# Patient Record
Sex: Male | Born: 1970 | Hispanic: Yes | Marital: Married | Smoking: Never smoker
Health system: Southern US, Community
[De-identification: ages and names within clinical notes are randomized; demographics above are authoritative.]

## PROBLEM LIST (undated history)

## (undated) DIAGNOSIS — R7303 Prediabetes: Secondary | ICD-10-CM

## (undated) DIAGNOSIS — N189 Chronic kidney disease, unspecified: Secondary | ICD-10-CM

## (undated) DIAGNOSIS — Z87442 Personal history of urinary calculi: Secondary | ICD-10-CM

## (undated) DIAGNOSIS — G709 Myoneural disorder, unspecified: Secondary | ICD-10-CM

## (undated) DIAGNOSIS — T7840XA Allergy, unspecified, initial encounter: Secondary | ICD-10-CM

## (undated) DIAGNOSIS — G4733 Obstructive sleep apnea (adult) (pediatric): Secondary | ICD-10-CM

## (undated) DIAGNOSIS — D759 Disease of blood and blood-forming organs, unspecified: Secondary | ICD-10-CM

## (undated) DIAGNOSIS — J45909 Unspecified asthma, uncomplicated: Secondary | ICD-10-CM

## (undated) DIAGNOSIS — J189 Pneumonia, unspecified organism: Secondary | ICD-10-CM

## (undated) HISTORY — PX: VASECTOMY: SHX75

## (undated) HISTORY — DX: Unspecified asthma, uncomplicated: J45.909

## (undated) HISTORY — DX: Allergy, unspecified, initial encounter: T78.40XA

## (undated) HISTORY — PX: MOUTH SURGERY: SHX715

---

## 2012-07-18 ENCOUNTER — Ambulatory Visit (INDEPENDENT_AMBULATORY_CARE_PROVIDER_SITE_OTHER): Payer: BC Managed Care – PPO | Admitting: Internal Medicine

## 2012-07-18 VITALS — BP 126/71 | HR 63 | Temp 97.8°F | Resp 16 | Ht 66.5 in | Wt 190.0 lb

## 2012-07-18 DIAGNOSIS — L919 Hypertrophic disorder of the skin, unspecified: Secondary | ICD-10-CM

## 2012-07-18 DIAGNOSIS — L909 Atrophic disorder of skin, unspecified: Secondary | ICD-10-CM

## 2012-07-18 DIAGNOSIS — Z8709 Personal history of other diseases of the respiratory system: Secondary | ICD-10-CM

## 2012-07-18 DIAGNOSIS — Z0000 Encounter for general adult medical examination without abnormal findings: Secondary | ICD-10-CM

## 2012-07-18 DIAGNOSIS — L918 Other hypertrophic disorders of the skin: Secondary | ICD-10-CM

## 2012-07-18 LAB — POCT CBC
Granulocyte percent: 59.9 %G (ref 37–80)
Hemoglobin: 16.7 g/dL (ref 14.1–18.1)
Lymph, poc: 1.7 (ref 0.6–3.4)
MCHC: 31.7 g/dL — AB (ref 31.8–35.4)
MPV: 9.3 fL (ref 0–99.8)
POC Granulocyte: 3.2 (ref 2–6.9)
POC MID %: 8.5 %M (ref 0–12)
RDW, POC: 13.4 %

## 2012-07-18 LAB — POCT URINALYSIS DIPSTICK
Bilirubin, UA: NEGATIVE
Leukocytes, UA: NEGATIVE
Nitrite, UA: NEGATIVE
Protein, UA: NEGATIVE
pH, UA: 5.5

## 2012-07-18 LAB — POCT UA - MICROSCOPIC ONLY
Casts, Ur, LPF, POC: NEGATIVE
Yeast, UA: NEGATIVE

## 2012-07-18 MED ORDER — ALBUTEROL SULFATE HFA 108 (90 BASE) MCG/ACT IN AERS: 2.0000 | INHALATION_SPRAY | Freq: Four times a day (QID) | RESPIRATORY_TRACT | Status: DC | PRN

## 2012-07-18 NOTE — Progress Notes (Signed)
  Subjective:    Patient ID: Anthony [name], male    DOB: Nov 30, 1970, 41 y.o.   MRN: [id number]  HPI Feels great, works as Pharmacist, hospital and very fit. Has few skin lesions under right arm to ck on. See hx form   Review of Systems ROS neg including/no cancers    Objective:   Physical Exam  Vitals reviewed. Constitutional: He is oriented to person, place, and time. He appears well-developed and well-nourished.  HENT:  Right Ear: External ear normal.  Left Ear: External ear normal.  Nose: Nose normal.  Mouth/Throat: Oropharynx is clear and moist.  Eyes: EOM are normal. Pupils are equal, round, and reactive to light.  Neck: Normal range of motion. No tracheal deviation present. No thyromegaly present.  Cardiovascular: Normal rate, regular rhythm and normal heart sounds.   Pulmonary/Chest: Effort normal and breath sounds normal.  Abdominal: Soft. Bowel sounds are normal. There is no tenderness.  Genitourinary: Penis normal.  Musculoskeletal: Normal range of motion.  Lymphadenopathy:    He has no cervical adenopathy.  Neurological: He is alert and oriented to person, place, and time. No cranial nerve deficit. He exhibits normal muscle tone. Coordination normal.  Skin: Skin is warm. Rash noted.  Psychiatric: He has a normal mood and affect. His behavior is normal. Judgment and thought content normal.  Skin tags axillae   Results for orders placed in visit on 07/18/12  POCT CBC      Component Value Range   WBC 5.3  4.6 - 10.2 K/uL   Lymph, poc 1.7  0.6 - 3.4   POC LYMPH PERCENT 31.6  10 - 50 %L   MID (cbc) 0.5  0 - 0.9   POC MID % 8.5  0 - 12 %M   POC Granulocyte 3.2  2 - 6.9   Granulocyte percent 59.9  37 - 80 %G   RBC 5.49  4.69 - 6.13 M/uL   Hemoglobin 16.7  14.1 - 18.1 g/dL   HCT, POC 14.7  82.9 - 53.7 %   MCV 95.9  80 - 97 fL   MCH, POC 30.4  27 - 31.2 pg   MCHC 31.7 (*) 31.8 - 35.4 g/dL   RDW, POC 56.2     Platelet Count, POC 232  142 - 424 K/uL   MPV 9.3  0 - 99.8  fL  POCT URINALYSIS DIPSTICK      Component Value Range   Color, UA yellow     Clarity, UA clear     Glucose, UA neg     Bilirubin, UA neg     Ketones, UA trace     Spec Grav, UA >=1.030     Blood, UA neg     pH, UA 5.5     Protein, UA neg     Urobilinogen, UA 0.2     Nitrite, UA neg     Leukocytes, UA Negative    POCT UA - MICROSCOPIC ONLY      Component Value Range   WBC, Ur, HPF, POC neg     RBC, urine, microscopic neg     Bacteria, U Microscopic neg     Mucus, UA neg     Epithelial cells, urine per micros neg     Crystals, Ur, HPF, POC neg     Casts, Ur, LPF, POC neg     Yeast, UA neg          Assessment & Plan:  Hx bronchospasm Skin tags Healthy cpe

## 2012-07-18 NOTE — Patient Instructions (Addendum)
Bronchospasm, Adult  Bronchospasm means that there is a spasm or tightening of the airways going into the lungs. Because the airways go into a spasm and get smaller it makes breathing more difficult.  For reasons not completely known, workings (functions) of the airways designed to protect the lungs become over active. This causes the airways to become more sensitive to:   Infection.   Weather.   Exercise.   Irritants.   Things that cause allergic reactions or allergies (allergens).  Frequent coughing or respiratory episodes should be checked for the cause. This condition may be made worse by exercise.  CAUSES   Inflammation is often the cause of this condition. Allergy, viral respiratory infections, or irritants in the air often cause this problem. Allergic reactions produce immediate and delayed responses. Late reactions may produce more serious inflammation. This may lead to increased reactivity of the airways. Sometimes this is inherited.  Some common triggers are:   Allergies.   Infection commonly triggers attacks. Antibiotics are not helpful for viral infections and usually do not help with attacks of bronchospasm.   Exercise (running, etc.) can trigger an attack. Proper pre-exercise medications help most individuals participate in sports. Swimming is the least likely sport to cause problems.   Irritants (for example, pollution, cigarette smoke, strong odors, aerosol sprays, paint fumes, etc.) may trigger attacks. You cannot smoke and do not allow smoking in your home. This is absolutely necessary. Show this instruction to mates, relatives and significant others that may not agree with you.   Weather changes may cause lung problems but moving around trying to find an ideal climate does not seem to be overly helpful. Winds increase molds and pollens in the air. Rain refreshes the air by washing irritants out. Cold air may cause irritation.   Emotional problems do not cause lung problems but can  trigger attacks.  SYMPTOMS   Wheezing is the most common symptom. Frequent coughing (with or without exercise and or crying) and repeated respiratory infections are all early warning signs of bronchospasm. Chest tightness and shortness of breath are other symptoms.  DIAGNOSIS   Early hidden bronchospasm may go for long periods of time without being detected. This is especially true if wheezing cannot be detected by your caregiver. Lung (pulmonary) function studies may help with diagnosis in these cases.  HOME CARE INSTRUCTIONS    It is necessary to remain calm during an attack. Try to relax and breathe more slowly. During this time medications may be given. If any breathing problems seem to be getting worse and are unresponsive to treatment seek immediate medical care.   If you have severe breathing difficulty or have had a life threatening attack it is probably a good idea for you to learn how to give adrenaline (epi-pen) or use an anaphylaxis kit. Your caregiver can help you with this. These are the same kits carried by people who have severe allergic reactions. This is especially important if you do not have readily accessible medical care.   With any severe breathing problems where epinephrine (adrenaline) has been given at home call 911 immediately as the delayed reaction may be even more severe.  SEEK MEDICAL CARE IF:    There is wheezing and shortness of breath, even if medications are given to prevent attacks.   An oral temperature above 102 F (38.9 C) develops.   There are muscle aches, chest pain, or thickening of sputum.   The sputum changes from clear or white to yellow, green,   gray, or bloody.   There are problems that may be related to the medicine you are given, such as a rash, itching, swelling, or trouble breathing.  SEEK IMMEDIATE MEDICAL CARE IF:    The usual medicines do not stop your wheezing, or there is increased coughing.   You have increased difficulty breathing.  MAKE SURE YOU:     Understand these instructions.   Will watch your condition.   Will get help right away if you are not doing well or get worse.  Document Released: 08/21/2003 Document Revised: 11/10/2011 Document Reviewed: 04/05/2008  ExitCare Patient Information 2013 [employer].

## 2012-07-19 LAB — COMPREHENSIVE METABOLIC PANEL
ALT: 28 U/L (ref 0–53)
AST: 30 U/L (ref 0–37)
Albumin: 4.5 g/dL (ref 3.5–5.2)
Calcium: 9.8 mg/dL (ref 8.4–10.5)
Chloride: 105 mEq/L (ref 96–112)
Potassium: 4.1 mEq/L (ref 3.5–5.3)

## 2012-07-19 LAB — LIPID PANEL
HDL: 44 mg/dL (ref >39)
LDL Cholesterol: 58 mg/dL (ref 0–99)

## 2014-03-06 ENCOUNTER — Other Ambulatory Visit: Payer: Self-pay | Admitting: Internal Medicine

## 2014-04-06 ENCOUNTER — Ambulatory Visit (INDEPENDENT_AMBULATORY_CARE_PROVIDER_SITE_OTHER): Payer: No Typology Code available for payment source | Admitting: Emergency Medicine

## 2014-04-06 VITALS — BP 118/86 | HR 81 | Temp 98.1°F | Resp 16 | Ht 67.0 in | Wt 187.0 lb

## 2014-04-06 DIAGNOSIS — Z202 Contact with and (suspected) exposure to infections with a predominantly sexual mode of transmission: Secondary | ICD-10-CM

## 2014-04-06 NOTE — Progress Notes (Signed)
[facility] [address], [place] [id number] [phone]  Date:  04/06/2014   Name:  Anthony [name]   DOB:  Jul 14, 1971   MRN:  [id number]  PCP:  No PCP Per Patient    Chief Complaint: Exposure to STD   History of Present Illness:  Anthony [name] is a 43 y.o. very pleasant male patient who presents with the following:  Requests STD check after unprotected sex 3 months ago. Not symptomatic.  History of STD 20 years ago.  No improvement with over the counter medications or other home remedies. Denies other complaint or health concern today.   There are no active problems to display for this patient.   Past Medical History  Diagnosis Date  . Allergy   . Asthma     Past Surgical History  Procedure Laterality Date  . Vasectomy      History  Substance Use Topics  . Smoking status: Never Smoker   . Smokeless tobacco: Not on file  . Alcohol Use: No    Family History  Problem Relation Age of Onset  . Epilepsy Mother   . Cancer Mother   . Diabetes Father   . Alcohol abuse Maternal Grandfather   . Diabetes Paternal Grandmother     Allergies  Allergen Reactions  . Aspirin Shortness Of Breath    Medication list has been reviewed and updated.  Current Outpatient Prescriptions on File Prior to Visit  Medication Sig Dispense Refill  . albuterol (PROVENTIL HFA;VENTOLIN HFA) 108 (90 BASE) MCG/ACT inhaler Inhale 2 puffs into the lungs every 6 (six) hours as needed for wheezing.  1 Inhaler  2  . Multiple Vitamins-Minerals (MULTIVITAMIN WITH MINERALS) tablet Take 1 tablet by mouth daily.       No current facility-administered medications on file prior to visit.    Review of Systems:  As per HPI, otherwise negative.    Physical Examination: Filed Vitals:   04/06/14 1204  BP: 118/86  Pulse: 81  Temp: 98.1 F (36.7 C)  Resp: 16   Filed Vitals:   04/06/14 1204  Height: 5\' 7"  (1.702 m)  Weight: 187 lb (84.823 kg)   Body mass index is 29.28  kg/(m^2). Ideal Body Weight: Weight in (lb) to have BMI = 25: 159.3   GEN: WDWN, NAD, Non-toxic, Alert & Oriented x 3 HEENT: Atraumatic, Normocephalic.  Ears and Nose: No external deformity. EXTR: No clubbing/cyanosis/edema NEURO: Normal gait.  PSYCH: Normally interactive. Conversant. Not depressed or anxious appearing.  Calm demeanor.    Assessment and Plan: STD check Labs pending.  Signed,  Phillips OdorJeffery Kramer Hanrahan, MD

## 2014-04-06 NOTE — Patient Instructions (Signed)

## 2014-04-07 LAB — RPR

## 2014-04-07 LAB — HSV(HERPES SIMPLEX VRS) I + II AB-IGG
HSV 1 Glycoprotein G Ab, IgG: 10.03 IV — ABNORMAL HIGH
HSV 2 GLYCOPROTEIN G AB, IGG: 0.24 IV

## 2014-04-07 LAB — GC/CHLAMYDIA PROBE AMP
CT Probe RNA: NEGATIVE
GC Probe RNA: NEGATIVE

## 2014-04-07 LAB — HIV ANTIBODY (ROUTINE TESTING W REFLEX): HIV 1&2 Ab, 4th Generation: NONREACTIVE

## 2014-08-27 ENCOUNTER — Other Ambulatory Visit: Payer: Self-pay | Admitting: Internal Medicine

## 2015-09-02 HISTORY — PX: CARPAL TUNNEL RELEASE: SHX101

## 2016-03-13 ENCOUNTER — Ambulatory Visit (INDEPENDENT_AMBULATORY_CARE_PROVIDER_SITE_OTHER): Payer: BLUE CROSS/BLUE SHIELD | Admitting: Urgent Care

## 2016-03-13 VITALS — BP 110/76 | HR 74 | Temp 98.1°F | Resp 18 | Ht 67.0 in | Wt 192.0 lb

## 2016-03-13 DIAGNOSIS — R5383 Other fatigue: Secondary | ICD-10-CM | POA: Diagnosis not present

## 2016-03-13 NOTE — Progress Notes (Signed)
    MRN: [id number] DOB: [birth year]  Subjective:   Anthony [name] is a 45 y.o. male presenting for chief complaint of testosterone  Reports that he would like to have his testosterone levels checked. States that he has been told that males in their forty's have decreased testosterone and is worried that this is the case for him. 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 Denies irritability, depression, impotence, fatigue.   [name] has a current medication list which includes the following prescription(s): albuterol and multivitamin with minerals. Also is allergic to aspirin.  [name]  has a past medical history of Allergy and Asthma. Also  has past surgical history that includes Vasectomy.  Objective:   Vitals: BP 110/76 mmHg  Pulse 74  Temp(Src) 98.1 F (36.7 C) (Oral)  Resp 18  Ht 5\' 7"  (1.702 m)  Wt 192 lb (87.091 kg)  BMI 30.06 kg/m2  SpO2 98%  Physical Exam  Constitutional: He is oriented to person, place, and time. He appears well-developed and well-nourished.  Cardiovascular: Normal rate.   Pulmonary/Chest: Effort normal.  Neurological: He is alert and oriented to person, place, and time.  Psychiatric: He has a normal mood and affect.    Assessment and Plan :   1. Decreased energy - I agreed to obtain testosterone level with patient. Discussed diagnosis of hypogonadism, options for treatment if this is the case including potential for adverse effects. Patient verbalized understanding and will come in for a testosterone level in the morning.  Wallis BambergMario Linnea Todisco, PA-C [facility] [phone] 03/13/2016 5:18 PM

## 2016-03-13 NOTE — Patient Instructions (Addendum)
Testosterone Testosterone is a hormone made by the male's testicles and by the adrenal glands, which are a pair of glands on top of the kidneys. Starting at puberty, testosterone stimulates the development of secondary sex characteristics. This includes a deeper voice, growth of muscles and body hair, and penis enlargement.  Females also produce testosterone in both the adrenal glands and ovaries. A male's body converts testosterone into estradiol, the main male sex hormone. An abnormal level of testosterone can cause health issues in both males and females. You may have this test if your health care provider suspects that an abnormal testosterone level is causing or contributing to other health problems. In males, symptoms of an abnormal testosterone level include:  Infertility.  Erectile dysfunction.  Delayed puberty or premature puberty. In females, symptoms of an abnormally high testosterone level include:  Infertility.  Polycystic ovarian syndrome (PCOS).  Developing masculine features (virilization). This test requires a blood sample taken from a vein in your arm or hand. The sample for this test is usually collected in the morning. The amount of testosterone in your blood is highest at that time. RESULTS It is your responsibility to obtain your test results. Ask the lab or department performing the test when and how you will get your results. Contact your health care provider to discuss any questions you have about your results.  The result of a blood test for testosterone will be given as a range of values. A testosterone level that is outside the normal range may indicate a health problem. Testosterone is measured in nanograms per deciliter (ng/dL). Range of Normal Values Ranges for normal values may vary among different labs and hospitals. You should always check with your health care provider after having lab work or other tests done to discuss whether your values are considered  within normal limits. Normal levels of total testosterone are as follows:  Male:  7 months to 43 years old: less than 30 ng/dL.  10-13 years old: less than 300 ng/dL.  14-15 years old: 170-540 ng/dL.  16-19 years old: 250-910 ng/dL.  43 years old and over: 280-1,080 ng/dL.  Male:  7 months to 43 years old: less than 30 ng/dL.  10-13 years old: less than 40 ng/dL.  14-15 years old: less than 60 ng/dL.  16-19 years old: less than 70 ng/dL.  43 years old and over: less than 70 ng/dL. Meaning of Results Outside Normal Value Ranges A testosterone level that is too low or too high can indicate a number of health problems. In males:  A high testosterone level can occur if you:  Have certain types of tumors.  Have an overactive thyroid gland (hyperthyroidism).  Use anabolic steroids.  Are starting puberty early (precocious puberty).  Have an inherited disorder that affects the adrenal glands (congenital adrenal hyperplasia).  A low testosterone level can occur if you:  Have certain genetic diseases.  Have had certain viral infections, such as mumps.  Have pituitary disease.  Have had an injury to the testicles.  Are an alcoholic. In females:  A high testosterone level can occur if you have:  Certain types of tumors.  An inherited disorder that affects certain cells in the adrenal glands (congenital adrenocortical hyperplasia).  PCOS.  A low testosterone level does not cause health problems. Discuss the results of your testosterone test with your health care provider. Your health care provider will use the results of this test and other tests to make a diagnosis.   This information   is not intended to replace advice given to you by your health care provider. Make sure you discuss any questions you have with your health care provider.   Document Released: 09/04/2004 Document Revised: 09/08/2014 Document Reviewed: 12/14/2013 Elsevier Interactive Patient  Education 2016 [employer]     IF you received an x-ray today, you will receive an invoice from [facility]. Please contact [facility] at [phone] with questions or concerns regarding your invoice.   IF you received labwork today, you will receive an invoice from [facility]. Please contact [facility] at [phone] with questions or concerns regarding your invoice.   Our billing staff will not be able to assist you with questions regarding bills from these companies.  You will be contacted with the lab results as soon as they are available. The fastest way to get your results is to activate your My Chart account. Instructions are located on the last page of this paperwork. If you have not heard from us regarding the results in 2 weeks, please contact this office.

## 2016-03-14 ENCOUNTER — Ambulatory Visit (INDEPENDENT_AMBULATORY_CARE_PROVIDER_SITE_OTHER): Payer: BLUE CROSS/BLUE SHIELD | Admitting: Urgent Care

## 2016-03-14 DIAGNOSIS — R5383 Other fatigue: Secondary | ICD-10-CM

## 2016-03-15 ENCOUNTER — Encounter: Payer: Self-pay | Admitting: Urgent Care

## 2016-03-15 ENCOUNTER — Other Ambulatory Visit: Payer: Self-pay | Admitting: Urgent Care

## 2016-03-15 LAB — TESTOSTERONE: Testosterone: 225 ng/dL — ABNORMAL LOW (ref 250–827)

## 2016-03-15 NOTE — Progress Notes (Signed)
Patient came in for labs only.

## 2016-07-14 ENCOUNTER — Other Ambulatory Visit: Payer: Self-pay | Admitting: Family Medicine

## 2016-07-14 ENCOUNTER — Other Ambulatory Visit: Payer: Self-pay | Admitting: Physician Assistant

## 2016-07-14 MED ORDER — ALBUTEROL SULFATE HFA 108 (90 BASE) MCG/ACT IN AERS: 2.0000 | INHALATION_SPRAY | Freq: Four times a day (QID) | RESPIRATORY_TRACT | 0 refills | Status: DC | PRN

## 2016-07-14 NOTE — Progress Notes (Signed)
Answering service call. [facility] computers are down, [facility] 1 inhaler sent, RTC for further eval prn.

## 2016-09-29 ENCOUNTER — Ambulatory Visit (INDEPENDENT_AMBULATORY_CARE_PROVIDER_SITE_OTHER): Payer: BLUE CROSS/BLUE SHIELD | Admitting: Urgent Care

## 2016-09-29 VITALS — BP 114/72 | HR 111 | Temp 98.5°F | Ht 67.0 in | Wt 199.6 lb

## 2016-09-29 DIAGNOSIS — J4520 Mild intermittent asthma, uncomplicated: Secondary | ICD-10-CM

## 2016-09-29 DIAGNOSIS — R05 Cough: Secondary | ICD-10-CM | POA: Diagnosis not present

## 2016-09-29 DIAGNOSIS — R059 Cough, unspecified: Secondary | ICD-10-CM

## 2016-09-29 MED ORDER — ALBUTEROL SULFATE HFA 108 (90 BASE) MCG/ACT IN AERS: 2.0000 | INHALATION_SPRAY | Freq: Four times a day (QID) | RESPIRATORY_TRACT | 5 refills | Status: DC | PRN

## 2016-09-29 MED ORDER — HYDROCOD POLST-CPM POLST ER 10-8 MG/5ML PO SUER: 5.0000 mL | Freq: Every evening | ORAL | 0 refills | Status: DC | PRN

## 2016-09-29 MED ORDER — BENZONATATE 100 MG PO CAPS: 100.0000 mg | ORAL_CAPSULE | Freq: Three times a day (TID) | ORAL | 0 refills | Status: DC | PRN

## 2016-09-29 NOTE — Progress Notes (Signed)
    MRN: [id number] DOB: 04-17-1971  Subjective:   Anthony [name] is a 46 y.o. male presenting for follow up on extrinsic asthma. Has managed this well with albuterol, needs a refill today. He is affected by weather changes and viral illnesses. Reports 5 day history of intermittent shob, wheezing when it started snowing. He has also felt chest congestion, mild nasal congestion, subjective fever. He is using otc cough drops and medications with good relief of his symptoms. Denies sinus pain, ear pain, ear drainage, chest pain, n/v, abdominal pain, rashes. Denies smoking cigarettes.  Anthony [name] has a current medication list which includes the following prescription(s): albuterol and multivitamin with minerals. Also is allergic to aspirin.  Anthony [name]  has a past medical history of Allergy and Asthma. Also  has a past surgical history that includes Vasectomy.  Objective:   Vitals: BP 114/72 (BP Location: Right Arm, Patient Position: Sitting, Cuff Size: Large)   Pulse (!) 111   Temp 98.5 F (36.9 C) (Oral)   Ht 5\' 7"  (1.702 m)   Wt 199 lb 9.6 oz (90.5 kg)   SpO2 96%   BMI 31.26 kg/m   Physical Exam  Constitutional: He is oriented to person, place, and time. He appears well-developed and well-nourished.  HENT:  TM's intact bilaterally, no effusions or erythema. Nasal turbinates pink and moist, nasal passages patent but with thick yellow mucus. No sinus tenderness. Oropharynx clear, mucous membranes moist, dentition in good repair.  Eyes: Right eye exhibits no discharge. Left eye exhibits no discharge. No scleral icterus.  Neck: Normal range of motion. Neck supple.  Cardiovascular: Normal rate, regular rhythm and intact distal pulses.  Exam reveals no gallop and no friction rub.   No murmur heard. Pulmonary/Chest: No respiratory distress. He has no wheezes. He has no rales.  Lymphadenopathy:    He has no cervical adenopathy.  Neurological: He is alert and oriented to person, place, and time.  Skin:  Skin is warm and dry.   Assessment and Plan :   1. Mild intermittent extrinsic asthma without complication - Refilled albuterol, patient manages his extrinsic asthma very well.  2. Cough - Likely viral in etiology, offered symptomatic relief. Rtc in 5-6 days if no improvement or lack of resolution of symptoms. Consider chest x-ray, antibiotic and/or short steroid course.  Wallis BambergMario Jayden Rudge, PA-C [facility] [phone] 09/29/2016 12:45 PM

## 2016-09-29 NOTE — Patient Instructions (Addendum)
Cough, Adult Coughing is a reflex that clears your throat and your airways. Coughing helps to heal and protect your lungs. It is normal to cough occasionally, but a cough that happens with other symptoms or lasts a long time may be a sign of a condition that needs treatment. A cough may last only 2-3 weeks (acute), or it may last longer than 8 weeks (chronic). What are the causes? Coughing is commonly caused by:  Breathing in substances that irritate your lungs.  A viral or bacterial respiratory infection.  Allergies.  Asthma.  Postnasal drip.  Smoking.  Acid backing up from the stomach into the esophagus (gastroesophageal reflux).  Certain medicines.  Chronic lung problems, including COPD (or rarely, lung cancer).  Other medical conditions such as heart failure. Follow these instructions at home: Pay attention to any changes in your symptoms. Take these actions to help with your discomfort:  Take medicines only as told by your health care provider.  If you were prescribed an antibiotic medicine, take it as told by your health care provider. Do not stop taking the antibiotic even if you start to feel better.  Talk with your health care provider before you take a cough suppressant medicine.  Drink enough fluid to keep your urine clear or pale yellow.  If the air is dry, use a cold steam vaporizer or humidifier in your bedroom or your home to help loosen secretions.  Avoid anything that causes you to cough at work or at home.  If your cough is worse at night, try sleeping in a semi-upright position.  Avoid cigarette smoke. If you smoke, quit smoking. If you need help quitting, ask your health care provider.  Avoid caffeine.  Avoid alcohol.  Rest as needed. Contact a health care provider if:  You have new symptoms.  You cough up pus.  Your cough does not get better after 2-3 weeks, or your cough gets worse.  You cannot control your cough with suppressant medicines  and you are losing sleep.  You develop pain that is getting worse or pain that is not controlled with pain medicines.  You have a fever.  You have unexplained weight loss.  You have night sweats. Get help right away if:  You cough up blood.  You have difficulty breathing.  Your heartbeat is very fast. This information is not intended to replace advice given to you by your health care provider. Make sure you discuss any questions you have with your health care provider. Document Released: 02/14/2011 Document Revised: 01/24/2016 Document Reviewed: 10/25/2014 Elsevier Interactive Patient Education  2017 Elsevier Inc.     Asthma, Adult Asthma is a recurring condition in which the airways tighten and narrow. Asthma can make it difficult to breathe. It can cause coughing, wheezing, and shortness of breath. Asthma episodes, also called asthma attacks, range from minor to life-threatening. Asthma cannot be cured, but medicines and lifestyle changes can help control it. What are the causes? Asthma is believed to be caused by inherited (genetic) and environmental factors, but its exact cause is unknown. Asthma may be triggered by allergens, lung infections, or irritants in the air. Asthma triggers are different for each person. Common triggers include:  Animal dander.  Dust mites.  Cockroaches.  Pollen from trees or grass.  Mold.  Smoke.  Air pollutants such as dust, household cleaners, hair sprays, aerosol sprays, paint fumes, strong chemicals, or strong odors.  Cold air, weather changes, and winds (which increase molds and pollens in the   air).  Strong emotional expressions such as crying or laughing hard.  Stress.  Certain medicines (such as aspirin) or types of drugs (such as beta-blockers).  Sulfites in foods and drinks. Foods and drinks that may contain sulfites include dried fruit, potato chips, and sparkling grape juice.  Infections or inflammatory conditions such as  the flu, a cold, or an inflammation of the nasal membranes (rhinitis).  Gastroesophageal reflux disease (GERD).  Exercise or strenuous activity. What are the signs or symptoms? Symptoms may occur immediately after asthma is triggered or many hours later. Symptoms include:  Wheezing.  Excessive nighttime or early morning coughing.  Frequent or severe coughing with a common cold.  Chest tightness.  Shortness of breath. How is this diagnosed? The diagnosis of asthma is made by a review of your medical history and a physical exam. Tests may also be performed. These may include:  Lung function studies. These tests show how much air you breathe in and out.  Allergy tests.  Imaging tests such as X-rays. How is this treated? Asthma cannot be cured, but it can usually be controlled. Treatment involves identifying and avoiding your asthma triggers. It also involves medicines. There are 2 classes of medicine used for asthma treatment:  Controller medicines. These prevent asthma symptoms from occurring. They are usually taken every day.  Reliever or rescue medicines. These quickly relieve asthma symptoms. They are used as needed and provide short-term relief. Your health care provider will help you create an asthma action plan. An asthma action plan is a written plan for managing and treating your asthma attacks. It includes a list of your asthma triggers and how they may be avoided. It also includes information on when medicines should be taken and when their dosage should be changed. An action plan may also involve the use of a device called a peak flow meter. A peak flow meter measures how well the lungs are working. It helps you monitor your condition. Follow these instructions at home:  Take medicines only as directed by your health care provider. Speak with your health care provider if you have questions about how or when to take the medicines.  Use a peak flow meter as directed by your  health care provider. Record and keep track of readings.  Understand and use the action plan to help minimize or stop an asthma attack without needing to seek medical care.  Control your home environment in the following ways to help prevent asthma attacks:  Do not smoke. Avoid being exposed to secondhand smoke.  Change your heating and air conditioning filter regularly.  Limit your use of fireplaces and wood stoves.  Get rid of pests (such as roaches and mice) and their droppings.  Throw away plants if you see mold on them.  Clean your floors and dust regularly. Use unscented cleaning products.  Try to have someone else vacuum for you regularly. Stay out of rooms while they are being vacuumed and for a short while afterward. If you vacuum, use a dust mask from a hardware store, a double-layered or microfilter vacuum cleaner bag, or a vacuum cleaner with a HEPA filter.  Replace carpet with wood, tile, or vinyl flooring. Carpet can trap dander and dust.  Use allergy-proof pillows, mattress covers, and box spring covers.  Wash bed sheets and blankets every week in hot water and dry them in a dryer.  Use blankets that are made of polyester or cotton.  Clean bathrooms and kitchens with bleach. If possible,   have someone repaint the walls in these rooms with mold-resistant paint. Keep out of the rooms that are being cleaned and painted.  Wash hands frequently. Contact a health care provider if:  You have wheezing, shortness of breath, or a cough even if taking medicine to prevent attacks.  The colored mucus you cough up (sputum) is thicker than usual.  Your sputum changes from clear or white to yellow, green, gray, or bloody.  You have any problems that may be related to the medicines you are taking (such as a rash, itching, swelling, or trouble breathing).  You are using a reliever medicine more than 2-3 times per week.  Your peak flow is still at 50-79% of your personal best  after following your action plan for 1 hour.  You have a fever. Get help right away if:  You seem to be getting worse and are unresponsive to treatment during an asthma attack.  You are short of breath even at rest.  You get short of breath when doing very little physical activity.  You have difficulty eating, drinking, or talking due to asthma symptoms.  You develop chest pain.  You develop a fast heartbeat.  You have a bluish color to your lips or fingernails.  You are light-headed, dizzy, or faint.  Your peak flow is less than 50% of your personal best. This information is not intended to replace advice given to you by your health care provider. Make sure you discuss any questions you have with your health care provider. Document Released: 08/18/2005 Document Revised: 01/30/2016 Document Reviewed: 03/17/2013 Elsevier Interactive Patient Education  2017 Elsevier Inc.     IF you received an x-ray today, you will receive an invoice from [facility]. Please contact [facility] at [phone] with questions or concerns regarding your invoice.   IF you received labwork today, you will receive an invoice from [facility]. Please contact [facility] at [phone] with questions or concerns regarding your invoice.   Our billing staff will not be able to assist you with questions regarding bills from these companies.  You will be contacted with the lab results as soon as they are available. The fastest way to get your results is to activate your My Chart account. Instructions are located on the last page of this paperwork. If you have not heard from us regarding the results in 2 weeks, please contact this office.      

## 2017-01-06 DIAGNOSIS — G5601 Carpal tunnel syndrome, right upper limb: Secondary | ICD-10-CM | POA: Diagnosis not present

## 2017-01-06 DIAGNOSIS — M25521 Pain in right elbow: Secondary | ICD-10-CM | POA: Diagnosis not present

## 2017-01-06 DIAGNOSIS — R200 Anesthesia of skin: Secondary | ICD-10-CM | POA: Diagnosis not present

## 2017-01-06 DIAGNOSIS — G5602 Carpal tunnel syndrome, left upper limb: Secondary | ICD-10-CM | POA: Diagnosis not present

## 2017-01-06 DIAGNOSIS — M7701 Medial epicondylitis, right elbow: Secondary | ICD-10-CM | POA: Diagnosis not present

## 2017-01-06 DIAGNOSIS — G5603 Carpal tunnel syndrome, bilateral upper limbs: Secondary | ICD-10-CM | POA: Diagnosis not present

## 2017-01-24 DIAGNOSIS — M25521 Pain in right elbow: Secondary | ICD-10-CM | POA: Diagnosis not present

## 2017-01-24 DIAGNOSIS — G5602 Carpal tunnel syndrome, left upper limb: Secondary | ICD-10-CM | POA: Diagnosis not present

## 2017-02-11 DIAGNOSIS — G5603 Carpal tunnel syndrome, bilateral upper limbs: Secondary | ICD-10-CM | POA: Diagnosis not present

## 2017-02-11 DIAGNOSIS — M25521 Pain in right elbow: Secondary | ICD-10-CM | POA: Diagnosis not present

## 2017-02-11 DIAGNOSIS — R200 Anesthesia of skin: Secondary | ICD-10-CM | POA: Diagnosis not present

## 2017-02-17 DIAGNOSIS — G5601 Carpal tunnel syndrome, right upper limb: Secondary | ICD-10-CM | POA: Diagnosis not present

## 2017-02-25 DIAGNOSIS — Z4789 Encounter for other orthopedic aftercare: Secondary | ICD-10-CM | POA: Diagnosis not present

## 2017-03-06 DIAGNOSIS — G5601 Carpal tunnel syndrome, right upper limb: Secondary | ICD-10-CM | POA: Diagnosis not present

## 2017-03-18 DIAGNOSIS — M25521 Pain in right elbow: Secondary | ICD-10-CM | POA: Diagnosis not present

## 2017-11-27 ENCOUNTER — Ambulatory Visit (INDEPENDENT_AMBULATORY_CARE_PROVIDER_SITE_OTHER): Payer: BLUE CROSS/BLUE SHIELD

## 2017-11-27 ENCOUNTER — Other Ambulatory Visit: Payer: Self-pay

## 2017-11-27 ENCOUNTER — Encounter: Payer: Self-pay | Admitting: Urgent Care

## 2017-11-27 ENCOUNTER — Ambulatory Visit: Payer: BLUE CROSS/BLUE SHIELD | Admitting: Urgent Care

## 2017-11-27 VITALS — BP 100/50 | HR 107 | Temp 98.2°F | Ht 66.5 in | Wt 216.2 lb

## 2017-11-27 DIAGNOSIS — R0600 Dyspnea, unspecified: Secondary | ICD-10-CM | POA: Diagnosis not present

## 2017-11-27 DIAGNOSIS — R05 Cough: Secondary | ICD-10-CM | POA: Diagnosis not present

## 2017-11-27 DIAGNOSIS — R0602 Shortness of breath: Secondary | ICD-10-CM | POA: Diagnosis not present

## 2017-11-27 DIAGNOSIS — E669 Obesity, unspecified: Secondary | ICD-10-CM

## 2017-11-27 DIAGNOSIS — J4520 Mild intermittent asthma, uncomplicated: Secondary | ICD-10-CM | POA: Diagnosis not present

## 2017-11-27 DIAGNOSIS — R059 Cough, unspecified: Secondary | ICD-10-CM

## 2017-11-27 MED ORDER — MONTELUKAST SODIUM 10 MG PO TABS
10.0000 mg | ORAL_TABLET | Freq: Every day | ORAL | 1 refills | Status: DC
Start: 2017-11-27 — End: 2018-12-16

## 2017-11-27 MED ORDER — FLUTICASONE PROPIONATE 50 MCG/ACT NA SUSP: 2.0000 | Freq: Every day | NASAL | 11 refills | Status: DC

## 2017-11-27 NOTE — Patient Instructions (Addendum)
Use Flonase, Singulair and Claritin together to address allergic rhinitis as a source of your symptoms. For persistent nasal congestion, use Sudafed 60mg -120mg  every 8 hours or 12 hours as needed, respectively.    IF you received an x-ray today, you will receive an invoice from [facility]. Please contact [facility] at [phone] with questions or concerns regarding your invoice.   IF you received labwork today, you will receive an invoice from [facility]. Please contact [facility] at [phone] with questions or concerns regarding your invoice.   Our billing staff will not be able to assist you with questions regarding bills from these companies.  You will be contacted with the lab results as soon as they are available. The fastest way to get your results is to activate your My Chart account. Instructions are located on the last page of this paperwork. If you have not heard from us regarding the results in 2 weeks, please contact this office.

## 2017-11-27 NOTE — Progress Notes (Signed)
    MRN: [id number] DOB: 1971-04-22  Subjective:   Anthony PutnamRudy Zuk is a 47 y.o. male presenting for 1 year history of intermittent episodes of paroxysmal nocturnal dyspnea. Has been worsening over the past week. Feels discomfort in the back of his throat. Has to take several minutes to clear his throat, nasal passages but has also felt a congestion in his chest. Has a history of asthma, has had more of a cough. He is using his albuterol inhaler more recently. Used it 3 times this morning since 3 am. Denies fever, sinus pain, throat pain, chest pain. He is using Claritin but not consistently.   Anthony GrumblesRudy has a current medication list which includes the following prescription(s): albuterol and multivitamin with minerals. Also is allergic to aspirin.  Anthony GrumblesRudy  has a past medical history of Allergy and Asthma. Also  has a past surgical history that includes Vasectomy.  Objective:   Vitals: BP (!) 100/50 (BP Location: Left Arm, Patient Position: Sitting, Cuff Size: Large)   Pulse (!) 107   Temp 98.2 F (36.8 C) (Oral)   Ht 5' 6.5" (1.689 m)   Wt 216 lb 2.6 oz (98 kg)   SpO2 95%   BMI 34.37 kg/m   Physical Exam  Constitutional: He is oriented to person, place, and time. He appears well-developed and well-nourished.  HENT:  Right Ear: Tympanic membrane normal.  Left Ear: Tympanic membrane normal.  Nose: No sinus tenderness.  Throat with post-nasal drainage.  Eyes: Right eye exhibits no discharge. Left eye exhibits no discharge. No scleral icterus.  Neck: Normal range of motion. Neck supple.  Cardiovascular: Normal rate, regular rhythm and intact distal pulses. Exam reveals no gallop and no friction rub.  No murmur heard. Pulmonary/Chest: No respiratory distress. He has no wheezes. He has no rales.  Diminished lung sounds even with deep inspiration.  Lymphadenopathy:    He has no cervical adenopathy.  Neurological: He is alert and oriented to person, place, and time.  Skin: Skin is warm and dry.    Psychiatric: He has a normal mood and affect.   Dg Chest 2 View  Result Date: 11/27/2017 CLINICAL DATA:  Cough.  Shortness of breath. EXAM: CHEST - 2 VIEW COMPARISON:  No recent prior. FINDINGS: Mediastinum and hilar structures normal. Heart size normal. No focal infiltrate. No pleural effusion or pneumothorax. IMPRESSION: No acute cardiopulmonary disease. Electronically Signed   By: Maisie Fushomas  Register   On: 11/27/2017 09:38   Assessment and Plan :   Cough - Plan: DG Chest 2 View  Shortness of breath - Plan: DG Chest 2 View, Ambulatory referral to Sleep Studies  Mild intermittent extrinsic asthma without complication - Plan: DG Chest 2 View  PND (paroxysmal nocturnal dyspnea) - Plan: Ambulatory referral to Sleep Studies  Obesity, unspecified classification, unspecified obesity type, unspecified whether serious comorbidity present - Plan: Ambulatory referral to Sleep Studies  Will start management for allergic rhinitis with Flonase, Claritin, Singulair. Referral for sleep study is pending.   Wallis BambergMario Brisia Schuermann, PA-C Primary [facility] at [facility] [phone] 11/27/2017  9:13 AM

## 2017-12-07 ENCOUNTER — Other Ambulatory Visit: Payer: Self-pay | Admitting: Urgent Care

## 2017-12-07 DIAGNOSIS — J4520 Mild intermittent asthma, uncomplicated: Secondary | ICD-10-CM

## 2017-12-16 ENCOUNTER — Telehealth: Payer: Self-pay | Admitting: Urgent Care

## 2017-12-16 NOTE — Telephone Encounter (Signed)
Copied from CRM [id number]. Topic: Quick Communication - See Telephone Encounter >> Dec 16, 2017  5:44 PM [name], Linda F wrote: CRM for notification. See Telephone encounter for: 12/16/17. Patient stated that after taking the Flonase and the Singulair, he is still having problems in the middle of the day. The inhaler is probably the only thing that's helping him throughout the day, but he thinks he's using to much of it.  He starts to have shortness of breath, coughing and labored breathing.  Please advise as to what he should do.

## 2017-12-17 NOTE — Telephone Encounter (Signed)
Phone call to patient. Unable to reach. If patient calls back, which inhaler does he think he's using too much of? How often and when is he using it?

## 2017-12-21 ENCOUNTER — Other Ambulatory Visit: Payer: Self-pay | Admitting: Urgent Care

## 2017-12-21 DIAGNOSIS — J4520 Mild intermittent asthma, uncomplicated: Secondary | ICD-10-CM

## 2017-12-23 ENCOUNTER — Telehealth: Payer: Self-pay | Admitting: General Practice

## 2017-12-23 NOTE — Telephone Encounter (Signed)
Refilled was placed on yesterday by provider Wallis BambergMario Mani PA-C.

## 2017-12-23 NOTE — Telephone Encounter (Signed)
Copied from CRM [id number]. Topic: Quick Communication - See Telephone Encounter >> Dec 23, 2017  2:41 PM Windy KalataMichael, Pandora Mccrackin L, NT wrote: CRM for notification. See Telephone encounter for: 12/23/17.  Patient is calling and states that he was checking on the status of VENTOLIN HFA 108 (90 Base) MCG/ACT inhaler. I informed him it was called in yesterday 12/22/17. He states with allergy season being so bad he is having to use more than usual and wants to know is there a medication that can help him so he is not using his haler so much. He states he is having to use it about 10 times a day.

## 2018-01-14 ENCOUNTER — Other Ambulatory Visit: Payer: Self-pay

## 2018-01-14 ENCOUNTER — Telehealth: Payer: Self-pay | Admitting: Urgent Care

## 2018-01-14 DIAGNOSIS — J4520 Mild intermittent asthma, uncomplicated: Secondary | ICD-10-CM

## 2018-01-14 MED ORDER — ALBUTEROL SULFATE HFA 108 (90 BASE) MCG/ACT IN AERS: INHALATION_SPRAY | RESPIRATORY_TRACT | 1 refills | Status: DC

## 2018-01-14 NOTE — Telephone Encounter (Signed)
Refilled [facility]

## 2018-01-14 NOTE — Telephone Encounter (Signed)
Copied from CRM [id number]. Topic: Quick Communication - Rx Refill/Question >> Jan 14, 2018  9:03 AM Oneal Grout wrote: Medication: VENTOLIN HFA 108 (90 Base) MCG/ACT inhaler Has the patient contacted their pharmacy? Yes.  New Pharmacy (Agent: If no, request that the patient contact the pharmacy for the refill.) Preferred Pharmacy (with phone number [facility] street name[facility] [address] Agent: Please be advised that RX refills may take up to 3 business days. We ask that you follow-up with your pharmacy.

## 2018-01-14 NOTE — Telephone Encounter (Signed)
Request for ventolin HFA 108 inhaler 18 g LR  12/22/17  Wallis Bamberg, PA [facility] [address] LOV 16/10/96   Please review for frequent use.

## 2018-01-18 ENCOUNTER — Ambulatory Visit: Payer: BLUE CROSS/BLUE SHIELD | Admitting: Neurology

## 2018-01-18 ENCOUNTER — Encounter: Payer: Self-pay | Admitting: Neurology

## 2018-01-18 VITALS — BP 125/74 | HR 101 | Ht 66.5 in | Wt 217.0 lb

## 2018-01-18 DIAGNOSIS — R0681 Apnea, not elsewhere classified: Secondary | ICD-10-CM | POA: Diagnosis not present

## 2018-01-18 DIAGNOSIS — E669 Obesity, unspecified: Secondary | ICD-10-CM

## 2018-01-18 DIAGNOSIS — G4719 Other hypersomnia: Secondary | ICD-10-CM

## 2018-01-18 DIAGNOSIS — J4521 Mild intermittent asthma with (acute) exacerbation: Secondary | ICD-10-CM

## 2018-01-18 DIAGNOSIS — R0683 Snoring: Secondary | ICD-10-CM

## 2018-01-18 DIAGNOSIS — R0689 Other abnormalities of breathing: Secondary | ICD-10-CM | POA: Diagnosis not present

## 2018-01-18 NOTE — Patient Instructions (Addendum)

## 2018-01-18 NOTE — Progress Notes (Signed)
Subjective:    Patient ID: Anthony [name] is a 47 y.o. male.  HPI     Anthony Foley, MD, [name] [name] [address], [address] [address] [place], [place] [address]  Dear Marquita Palms,   I saw your patient, Anthony [name], upon your kind request, in my neurologic clinic today for initial consultation of his sleep disorder, in particular, concern for underlying obstructive sleep apnea. The patient is unaccompanied today. As you know, Anthony [name] is a 47 year old right-handed gentleman with an underlying medical history of asthma, allergies, nocturnal cough and dyspnea and obesity, who reports snoring and excessive daytime somnolence. I reviewed your office note from 11/27/2017. His Epworth sleepiness score is 14 out of 24 today, fatigue score is 1963. He reports waking up with a sense of gasping for air and sleep disruption with waking up several times in the middle of the night. He lives with his wife and his children, they have 4 children. He drinks alcohol rarely, caffeine daily, he is a nonsmoker.His father snores but could not go through with a sleep study as I understand. Patient reports a bedtime of around 9 PM and does not have trouble going to sleep but trouble staying asleep. He denies telltale symptoms of restless leg syndrome but is restless at times. He works as a Therapist, music. He gets about 3-4 hours consolidated sleep and after that it is disrupted. He does not have recurrent morning headaches and does not have night to night nocturia. His wife has noted positives in his breathing while he is asleep. They have several pets in the household which includes one dog and 4 cats.   His Past Medical History Is Significant For: Past Medical History:  Diagnosis Date  . Allergy   . Asthma     His Past Surgical History Is Significant For: Past Surgical History:  Procedure Laterality Date  . VASECTOMY      His Family History Is Significant  For: Family History  Problem Relation Age of Onset  . Epilepsy Mother   . Cancer Mother   . Diabetes Father   . Alcohol abuse Maternal Grandfather   . Diabetes Paternal Grandmother     His Social History Is Significant For: Social History   Socioeconomic History  . Marital status: Married    Spouse name: Not on file  . Number of children: Not on file  . Years of education: Not on file  . Highest education level: Not on file  Occupational History  . Not on file  Social Needs  . Financial resource strain: Not on file  . Food insecurity:    Worry: Not on file    Inability: Not on file  . Transportation needs:    Medical: Not on file    Non-medical: Not on file  Tobacco Use  . Smoking status: Never Smoker  . Smokeless tobacco: Never Used  Substance and Sexual Activity  . Alcohol use: No  . Drug use: No  . Sexual activity: Yes    Comment: Vasectomy  Lifestyle  . Physical activity:    Days per week: Not on file    Minutes per session: Not on file  . Stress: Not on file  Relationships  . Social connections:    Talks on phone: Not on file    Gets together: Not on file    Attends religious service: Not on file    Active member of club or organization: Not on file  Attends meetings of clubs or organizations: Not on file    Relationship status: Not on file  Other Topics Concern  . Not on file  Social History Narrative  . Not on file    His Allergies Are:  Allergies  Allergen Reactions  . Aspirin Shortness Of Breath  :   His Current Medications Are:  Outpatient Encounter Medications as of 01/18/2018  Medication Sig  . albuterol (VENTOLIN HFA) 108 (90 Base) MCG/ACT inhaler INHALE 2 PUFFS INTO THE LUNGS EVERY 6 HOURS AS NEEDED  . fluticasone (FLONASE) 50 MCG/ACT nasal spray Place 2 sprays into both nostrils daily.  . montelukast (SINGULAIR) 10 MG tablet Take 1 tablet (10 mg total) by mouth at bedtime.  . Multiple Vitamins-Minerals (MULTIVITAMIN WITH MINERALS)  tablet Take 1 tablet by mouth daily.  Marland Kitchen testosterone cypionate (DEPOTESTOSTERONE CYPIONATE) 200 MG/ML injection   . UNABLE TO FIND Med Name: Steroids   No facility-administered encounter medications on file as of 01/18/2018.   :  Review of Systems:  Out of a complete 14 point review of systems, all are reviewed and negative with the exception of these symptoms as listed below: Review of Systems  Neurological:       Pt presents today to discuss his sleep. Pt has never had a sleep study but does endorse snoring.  Epworth Sleepiness Scale 0= would never doze 1= slight chance of dozing 2= moderate chance of dozing 3= high chance of dozing  Sitting and reading: 2 Watching TV: 2 Sitting inactive in a public place (ex. Theater or meeting): 3 As a passenger in a car for an hour without a break: 3 Lying down to rest in the afternoon: 3 Sitting and talking to someone: 0 Sitting quietly after lunch (no alcohol): 0 In a car, while stopped in traffic: 1 Total: 14     Objective:  Neurological Exam  Physical Exam Physical Examination:   Vitals:   01/18/18 1106  BP: 125/74  Pulse: (!) 101    General Examination: The patient is a very pleasant 47 y.o. male in no acute distress. He appears well-developed and well-nourished and well groomed. Appears muscular, rather than obese.  HEENT: Normocephalic, atraumatic, pupils are equal, round and reactive to light and accommodation. Funduscopic exam is normal with sharp disc margins noted. Extraocular tracking is good without limitation to gaze excursion or nystagmus noted. Normal smooth pursuit is noted. Hearing is grossly intact. Face is symmetric with normal facial animation and normal facial sensation. Speech is clear with no dysarthria noted. There is no hypophonia. There is no lip, neck/head, jaw or voice tremor. Neck is supple with full range of passive and active motion. There are no carotid bruits on auscultation. Oropharynx exam reveals:  mild mouth dryness, good dental hygiene and moderate airway crowding, due to smaller airway opening. Mallampati is class II. Tongue protrudes centrally and palate elevates symmetrically. Tonsils are about 1+ in size. Neck size is 17 1/8 inches. He has a Mild overbite. Nasal inspection reveals no significant nasal mucosal bogginess or redness and no septal deviation.   Chest: Clear to auscultation without wheezing, rhonchi or crackles noted.  Heart: S1+S2+0, regular and normal without murmurs, rubs or gallops noted.   Abdomen: Soft, non-tender and non-distended with normal bowel sounds appreciated on auscultation.  Extremities: There is no pitting edema in the distal lower extremities bilaterally. Pedal pulses are intact.  Skin: Warm and dry without trophic changes noted.  Musculoskeletal: exam reveals no obvious joint deformities, tenderness or joint  swelling or erythema.   Neurologically:  Mental status: The patient is awake, alert and oriented in all 4 spheres. His immediate and remote memory, attention, language skills and fund of knowledge are appropriate. There is no evidence of aphasia, agnosia, apraxia or anomia. Speech is clear with normal prosody and enunciation. Thought process is linear. Mood is normal and affect is normal.  Cranial nerves II - XII are as described above under HEENT exam. In addition: shoulder shrug is normal with equal shoulder height noted. Motor exam: Normal bulk, strength and tone is noted. There is no drift, tremor or rebound. Romberg is negative. Reflexes are 2+ throughout. Fine motor skills and coordination: intact with normal finger taps, normal hand movements, normal rapid alternating patting, normal foot taps and normal foot agility.  Cerebellar testing: No dysmetria or intention tremor on finger to nose testing. Heel to shin is unremarkable bilaterally. There is no truncal or gait ataxia.  Sensory exam: intact to light touch in the upper and lower  extremities.  Gait, station and balance: He stands easily. No veering to one side is noted. No leaning to one side is noted. Posture is age-appropriate and stance is narrow based. Gait shows normal stride length and normal pace. No problems turning are noted. Tandem walk is unremarkable. Intact toe and heel stance is noted.               Assessment and Plan:  In summary, Anthony [name] is a very pleasant 47 y.o.-year old male with an underlying medical history of asthma, allergies, nocturnal cough and dyspnea and mild obesity by BMI criteria, whose history and physical exam are concerning for obstructive sleep apnea (OSA). I had a long chat with the patient about my findings and the diagnosis of OSA, its prognosis and treatment options. We talked about medical treatments, surgical interventions and non-pharmacological approaches. I explained in particular the risks and ramifications of untreated moderate to severe OSA, especially with respect to developing cardiovascular disease down the Road, including congestive heart failure, difficult to treat hypertension, cardiac arrhythmias, or stroke. Even type 2 diabetes has, in part, been linked to untreated OSA. Symptoms of untreated OSA include daytime sleepiness, memory problems, mood irritability and mood disorder such as depression and anxiety, lack of energy, as well as recurrent headaches, especially morning headaches. We talked about trying to maintain a healthy lifestyle in general, as well as the importance of weight control. I encouraged the patient to eat healthy, exercise daily and keep well hydrated, to keep a scheduled bedtime and wake time routine, to not skip any meals and eat healthy snacks in between meals. I advised the patient not to drive when feeling sleepy.  I recommended the following at this time: sleep study with potential positive airway pressure titration. (We will score hypopneas at 3%).   I explained the sleep test procedure to the  patient and also outlined possible surgical and non-surgical treatment options of OSA, including the use of a custom-made dental device (which would require a referral to a specialist dentist or oral surgeon), upper airway surgical options, such as pillar implants, radiofrequency surgery, tongue base surgery, and UPPP (which would involve a referral to an ENT surgeon). Rarely, jaw surgery such as mandibular advancement may be considered.  I also explained the CPAP treatment option to the patient, who indicated that he would be willing to try CPAP if the need arises. I explained the importance of being compliant with PAP treatment, not only for insurance purposes but  primarily to improve His symptoms, and for the patient's long term health benefit, including to reduce His cardiovascular risks. I answered all his questions today and the patient was in agreement. I would like to see him back after the sleep study is completed and encouraged him to call with any interim questions, concerns, problems or updates.   Thank you very much for allowing me to participate in the care of this nice patient. If I can be of any further assistance to you please do not hesitate to call me at [phone].  Sincerely,   [name], MD, [name]

## 2018-01-19 ENCOUNTER — Telehealth: Payer: Self-pay

## 2018-01-19 DIAGNOSIS — R0681 Apnea, not elsewhere classified: Secondary | ICD-10-CM

## 2018-01-19 NOTE — Telephone Encounter (Signed)
VO for HST from Dr. Athar received. HST order placed.  

## 2018-01-19 NOTE — Telephone Encounter (Signed)
[employer] denied in lab sleep study, need HST order 

## 2018-02-01 ENCOUNTER — Ambulatory Visit (INDEPENDENT_AMBULATORY_CARE_PROVIDER_SITE_OTHER): Payer: BLUE CROSS/BLUE SHIELD | Admitting: Neurology

## 2018-02-01 DIAGNOSIS — R0681 Apnea, not elsewhere classified: Secondary | ICD-10-CM

## 2018-02-01 DIAGNOSIS — G4733 Obstructive sleep apnea (adult) (pediatric): Secondary | ICD-10-CM

## 2018-02-01 DIAGNOSIS — G4734 Idiopathic sleep related nonobstructive alveolar hypoventilation: Secondary | ICD-10-CM

## 2018-02-05 NOTE — Procedures (Signed)
[facility] [address] [place], [place] [address] NAME:  Anthony PutnamRudy [name]                                                                DOB: 1970-11-12 MEDICAL RECORD NUMBER  [id number]                                        DOS: 02/01/18  REFERRING PHYSICIAN: Wallis BambergMario Mani, PA-C STUDY PERFORMED: Home Sleep Test HISTORY: 47 year old man with a history of asthma, allergies, nocturnal cough and dyspnea and obesity, who reports snoring and excessive daytime somnolence. His Epworth sleepiness score is 14 out of 24, BMI: 35.0  STUDY RESULTS:  Total Recording Time:  7 hours, 27  minutes Total Apnea/Hypopnea Index (AHI):    24/h, RDI: 26.5 /h Average Oxygen Saturation:   92%, Lowest Oxygen Desaturation:  80%  Total Time Oxygen Saturation Below or at 88 %:  29 minutes (7%) Average Heart Rate:   81 bpm  IMPRESSION: OSA, nocturnal hypoxemia RECOMMENDATION: This home sleep test demonstrates moderate obstructive sleep apnea with a total AHI of 24/hour and O2 nadir of 80%, with significant time below 89% saturation, indicating nocturnal hypoxemia. Given the patient's medical history and sleep related complaints, treatment with positive airway pressure (in the form of CPAP) is recommended. This will require a full night CPAP titration study for proper treatment settings, O2 monitoring and mask fitting. Based on the severity of the sleep disordered breathing an attended titration study is indicated. However, patient's insurance has denied an attended sleep study; therefore, the patient will be advised to proceed with an autoPAP titration/trial at home for now. Please note that untreated obstructive sleep apnea may carry additional perioperative morbidity. Patients with significant obstructive sleep apnea should receive perioperative PAP therapy and the surgeons and particularly the anesthesiologist should be informed of the diagnosis and the severity of the sleep disordered breathing. The  patient should be cautioned not to drive, work at heights, or operate dangerous or heavy equipment when tired or sleepy. Review and reiteration of good sleep hygiene measures should be pursued with any patient. Other causes of the patient's symptoms, including circadian rhythm disturbances, an underlying mood disorder, medication effect and/or an underlying medical problem cannot be ruled out based on this test. Clinical correlation is recommended. The patient and his referring provider will be notified of the test results. The [facility]. I certify that I have reviewed the raw data recording prior to the issuance of this report in accordance with the standards of the American Academy of Sleep Medicine (AASM).  Huston FoleySaima Jood Retana, MD, [name] [facility]) Diplomat, ABPN (Neurology and Sleep)

## 2018-02-05 NOTE — Progress Notes (Signed)
Patient referred by Wallis BambergMario Mani, PA, seen by me on 01/18/18, HST on 02/01/18.  Please call and notify the patient that the recent home sleep test showed obstructive sleep apnea. OSA is the moderate range and ideally would warrant a lab attended CPAP titration study for proper CPAP therapy (mask fitting, valid titration, O2 monitoring etc.), but his insurance has denied an attended sleep study. Therefore, I recommend treatment for this in the form of autoPAP, which means, that we don't have to bring him in for a sleep study with CPAP, but will let him try an autoPAP machine at home, through a DME company (of his choice, or as per insurance requirement). The DME representative will educate him on how to use the machine, how to put the mask on, etc. I have placed an order in the chart. Please send referral, talk to patient, send report to referring MD. We will need a FU in sleep clinic for 10 weeks post-PAP set up, please arrange that with me or one of our NPs. Thanks,   Huston FoleySaima Sheniah Supak, MD, [name] [facility])

## 2018-02-05 NOTE — Addendum Note (Signed)
Addended by: Huston FoleyATHAR, Shekina Cordell on: 02/05/2018 01:27 PM   Modules accepted: Orders

## 2018-02-09 ENCOUNTER — Telehealth: Payer: Self-pay

## 2018-02-09 NOTE — Telephone Encounter (Signed)
I called pt. I advised pt that Dr. Frances FurbishAthar reviewed their sleep study results and found that pt has moderate osa. Dr. Frances FurbishAthar recommends that pt start an auto pap, since pt's insurance denied an in-lab titration study. I reviewed PAP compliance expectations with the pt. Pt is agreeable to starting an auto-PAP. I advised pt that an order will be sent to a DME, [facility], and [facility] will call the pt within about one week after they file with the pt's insurance. [facility] will show the pt how to use the machine, fit for masks, and troubleshoot the auto-PAP if needed. A follow up appt was made for insurance purposes with Dr. Frances FurbishAthar on 05/11/18 at 8:30am. Pt verbalized understanding to arrive 15 minutes early and bring their auto-PAP. A letter with all of this information in it will be mailed to the pt as a reminder. I verified with the pt that the address we have on file is correct. Pt verbalized understanding of results. Pt had no questions at this time but was encouraged to call back if questions arise.

## 2018-02-09 NOTE — Telephone Encounter (Signed)
Pr returned RN's call.

## 2018-02-09 NOTE — Telephone Encounter (Signed)
I called pt to discuss his sleep study results. No answer, left a message asking him to call me back. 

## 2018-02-09 NOTE — Telephone Encounter (Signed)
-----   Message from Huston FoleySaima Athar, MD sent at 02/05/2018  1:27 PM EDT ----- Patient referred by Wallis BambergMario Mani, PA, seen by me on 01/18/18, HST on 02/01/18.  Please call and notify the patient that the recent home sleep test showed obstructive sleep apnea. OSA is the moderate range and ideally would warrant a lab attended CPAP titration study for proper CPAP therapy (mask fitting, valid titration, O2 monitoring etc.), but his insurance has denied an attended sleep study. Therefore, I recommend treatment for this in the form of autoPAP, which means, that we don't have to bring him in for a sleep study with CPAP, but will let him try an autoPAP machine at home, through a DME company (of his choice, or as per insurance requirement). The DME representative will educate him on how to use the machine, how to put the mask on, etc. I have placed an order in the chart. Please send referral, talk to patient, send report to referring MD. We will need a FU in sleep clinic for 10 weeks post-PAP set up, please arrange that with me or one of our NPs. Thanks,   Huston FoleySaima Athar, MD, [name] [facility])

## 2018-03-10 ENCOUNTER — Other Ambulatory Visit: Payer: Self-pay | Admitting: Urgent Care

## 2018-03-10 DIAGNOSIS — J4520 Mild intermittent asthma, uncomplicated: Secondary | ICD-10-CM

## 2018-05-11 ENCOUNTER — Ambulatory Visit: Payer: Self-pay | Admitting: Neurology

## 2018-06-21 ENCOUNTER — Other Ambulatory Visit: Payer: Self-pay

## 2018-06-21 ENCOUNTER — Ambulatory Visit: Payer: BLUE CROSS/BLUE SHIELD | Admitting: Family Medicine

## 2018-06-21 ENCOUNTER — Encounter: Payer: Self-pay | Admitting: Family Medicine

## 2018-06-21 VITALS — BP 121/77 | HR 93 | Temp 97.9°F | Resp 18 | Ht <= 58 in | Wt 218.4 lb

## 2018-06-21 DIAGNOSIS — G4733 Obstructive sleep apnea (adult) (pediatric): Secondary | ICD-10-CM | POA: Diagnosis not present

## 2018-06-21 NOTE — Patient Instructions (Signed)
° ° ° °  If you have lab work done today you will be contacted with your lab results within the next 2 weeks.  If you have not heard from us then please contact us. The fastest way to get your results is to register for My Chart. ° ° °IF you received an x-ray today, you will receive an invoice from [facility]. Please contact [facility] at [phone] with questions or concerns regarding your invoice.  ° °IF you received labwork today, you will receive an invoice from [facility]. Please contact [facility] at [phone] with questions or concerns regarding your invoice.  ° °Our billing staff will not be able to assist you with questions regarding bills from these companies. ° °You will be contacted with the lab results as soon as they are available. The fastest way to get your results is to activate your My Chart account. Instructions are located on the last page of this paperwork. If you have not heard from us regarding the results in 2 weeks, please contact this office. °  ° ° ° °

## 2018-06-21 NOTE — Progress Notes (Signed)
Chief Complaint  Patient presents with  . Establish Care    HPI  Pt reports that he takes the albuterol prn He only uses hte inhaler rarely He typically uses it more during the weather changes and used it once a week the past few weeks He uses his inhaler for the sleep apnea  He reports that he had a home 02/01/2018 Moderate OSA and was advised to use a CPAP but he is not sure if he wants to use He is interested in a device called inspire  He reports that he is concerned about the CPAP machine   Past Medical History:  Diagnosis Date  . Allergy   . Asthma     Current Outpatient Medications  Medication Sig Dispense Refill  . albuterol (VENTOLIN HFA) 108 (90 Base) MCG/ACT inhaler INHALE 2 PUFFS INTO THE LUNGS EVERY 6 HOURS AS NEEDED 18 Inhaler 1  . fluticasone (FLONASE) 50 MCG/ACT nasal spray Place 2 sprays into both nostrils daily. 16 g 11  . montelukast (SINGULAIR) 10 MG tablet Take 1 tablet (10 mg total) by mouth at bedtime. 90 tablet 1  . Multiple Vitamins-Minerals (MULTIVITAMIN WITH MINERALS) tablet Take 1 tablet by mouth daily.    Marland Kitchen testosterone cypionate (DEPOTESTOSTERONE CYPIONATE) 200 MG/ML injection     . UNABLE TO FIND Med Name: Steroids     No current facility-administered medications for this visit.     Allergies:  Allergies  Allergen Reactions  . Aspirin Shortness Of Breath    Past Surgical History:  Procedure Laterality Date  . VASECTOMY      Social History   Socioeconomic History  . Marital status: Married    Spouse name: Not on file  . Number of children: Not on file  . Years of education: Not on file  . Highest education level: Not on file  Occupational History  . Not on file  Social Needs  . Financial resource strain: Not on file  . Food insecurity:    Worry: Not on file    Inability: Not on file  . Transportation needs:    Medical: Not on file    Non-medical: Not on file  Tobacco Use  . Smoking status: Never Smoker  . Smokeless  tobacco: Never Used  Substance and Sexual Activity  . Alcohol use: No  . Drug use: No  . Sexual activity: Yes    Comment: Vasectomy  Lifestyle  . Physical activity:    Days per week: Not on file    Minutes per session: Not on file  . Stress: Not on file  Relationships  . Social connections:    Talks on phone: Not on file    Gets together: Not on file    Attends religious service: Not on file    Active member of club or organization: Not on file    Attends meetings of clubs or organizations: Not on file    Relationship status: Not on file  Other Topics Concern  . Not on file  Social History Narrative  . Not on file    Family History  Problem Relation Age of Onset  . Epilepsy Mother   . Cancer Mother   . Diabetes Father   . Alcohol abuse Maternal Grandfather   . Diabetes Paternal Grandmother      ROS Review of Systems See HPI Constitution: No fevers or chills No malaise No diaphoresis Skin: No rash or itching Eyes: no blurry vision, no double vision GU: no dysuria or hematuria Neuro:  no dizziness or headaches all others reviewed and negative   Objective: Vitals:   06/21/18 1049  BP: 121/77  Pulse: 93  Resp: 18  Temp: 97.9 F (36.6 C)  TempSrc: Oral  SpO2: 97%  Weight: 218 lb 6.4 oz (99.1 kg)  Height: 1' (0.305 m)    Physical Exam  Assessment and Plan Jasai was seen today for establish care.  Diagnoses and all orders for this visit:  OSA (obstructive sleep apnea) -     Ambulatory referral to ENT  discussed at length the various ways that apnea can occur  Discussed that patients with apnea that is untreated have increase in all cause mortality compared to patient treated for apnea Discussed discussion with ENT about causes of obstruction   Tam Delisle A Creta Levin

## 2018-06-22 ENCOUNTER — Encounter: Payer: Self-pay | Admitting: Family Medicine

## 2018-06-22 ENCOUNTER — Ambulatory Visit (INDEPENDENT_AMBULATORY_CARE_PROVIDER_SITE_OTHER): Payer: BLUE CROSS/BLUE SHIELD | Admitting: Family Medicine

## 2018-06-22 ENCOUNTER — Other Ambulatory Visit: Payer: Self-pay

## 2018-06-22 VITALS — BP 116/77 | HR 90 | Temp 97.9°F | Resp 18 | Ht 66.0 in | Wt 219.2 lb

## 2018-06-22 DIAGNOSIS — L918 Other hypertrophic disorders of the skin: Secondary | ICD-10-CM

## 2018-06-22 DIAGNOSIS — Z79890 Hormone replacement therapy (postmenopausal): Secondary | ICD-10-CM

## 2018-06-22 DIAGNOSIS — Z0001 Encounter for general adult medical examination with abnormal findings: Secondary | ICD-10-CM | POA: Diagnosis not present

## 2018-06-22 DIAGNOSIS — Z13220 Encounter for screening for lipoid disorders: Secondary | ICD-10-CM

## 2018-06-22 DIAGNOSIS — Z833 Family history of diabetes mellitus: Secondary | ICD-10-CM | POA: Diagnosis not present

## 2018-06-22 DIAGNOSIS — Z131 Encounter for screening for diabetes mellitus: Secondary | ICD-10-CM | POA: Diagnosis not present

## 2018-06-22 DIAGNOSIS — Z0000 Encounter for general adult medical examination without abnormal findings: Secondary | ICD-10-CM

## 2018-06-22 DIAGNOSIS — Z5181 Encounter for therapeutic drug level monitoring: Secondary | ICD-10-CM | POA: Diagnosis not present

## 2018-06-22 MED ORDER — LIDOCAINE-EPINEPHRINE 1 %-1:100000 IJ SOLN
3.0000 mL | Freq: Once | INTRAMUSCULAR | Status: AC
  Administered 2018-06-22: 3 mL via INTRADERMAL

## 2018-06-22 NOTE — Progress Notes (Signed)
Chief Complaint  Patient presents with  . Annual Exam    Subjective:  Anthony [name] is a 47 y.o. male here for a health maintenance visit.  Patient is established pt  There are no active problems to display for this patient.  He takes exogenous IM testosterone given by a friend who gets it through a medical supply company He reports that his last levels were low but not low enough to warrant supplementation by his PCP and since he was weight lifting he started on testosterone He denies mood swings, chest pains, elevated bp but admits his pulse runs in the 90s  Past Medical History:  Diagnosis Date  . Allergy   . Asthma     Past Surgical History:  Procedure Laterality Date  . VASECTOMY       Outpatient Medications Prior to Visit  Medication Sig Dispense Refill  . albuterol (VENTOLIN HFA) 108 (90 Base) MCG/ACT inhaler INHALE 2 PUFFS INTO THE LUNGS EVERY 6 HOURS AS NEEDED 18 Inhaler 1  . fluticasone (FLONASE) 50 MCG/ACT nasal spray Place 2 sprays into both nostrils daily. 16 g 11  . montelukast (SINGULAIR) 10 MG tablet Take 1 tablet (10 mg total) by mouth at bedtime. 90 tablet 1  . Multiple Vitamins-Minerals (MULTIVITAMIN WITH MINERALS) tablet Take 1 tablet by mouth daily.    Marland Kitchen testosterone cypionate (DEPOTESTOSTERONE CYPIONATE) 200 MG/ML injection     . UNABLE TO FIND Med Name: Steroids     No facility-administered medications prior to visit.     Allergies  Allergen Reactions  . Aspirin Shortness Of Breath     Family History  Problem Relation Age of Onset  . Epilepsy Mother   . Cancer Mother   . Diabetes Father   . Alcohol abuse Maternal Grandfather   . Diabetes Paternal Grandmother      Health Habits: Dental Exam: up to date Eye Exam: up to date Exercise: 7 times/week on average Current exercise activities: walking/running Diet: balanced   Social History   Socioeconomic History  . Marital status: Married    Spouse name: Not on file  . Number of  children: Not on file  . Years of education: Not on file  . Highest education level: Not on file  Occupational History  . Not on file  Social Needs  . Financial resource strain: Not on file  . Food insecurity:    Worry: Not on file    Inability: Not on file  . Transportation needs:    Medical: Not on file    Non-medical: Not on file  Tobacco Use  . Smoking status: Never Smoker  . Smokeless tobacco: Never Used  Substance and Sexual Activity  . Alcohol use: No  . Drug use: No  . Sexual activity: Yes    Comment: Vasectomy  Lifestyle  . Physical activity:    Days per week: Not on file    Minutes per session: Not on file  . Stress: Not on file  Relationships  . Social connections:    Talks on phone: Not on file    Gets together: Not on file    Attends religious service: Not on file    Active member of club or organization: Not on file    Attends meetings of clubs or organizations: Not on file    Relationship status: Not on file  . Intimate partner violence:    Fear of current or ex partner: Not on file    Emotionally abused: Not on file  Physically abused: Not on file    Forced sexual activity: Not on file  Other Topics Concern  . Not on file  Social History Narrative  . Not on file   Social History   Substance and Sexual Activity  Alcohol Use No   Social History   Tobacco Use  Smoking Status Never Smoker  Smokeless Tobacco Never Used   Social History   Substance and Sexual Activity  Drug Use No    Health Maintenance: See under health Maintenance activity for review of completion dates as well.  There is no immunization history on file for this patient.    Depression Screen-PHQ2/9 Depression screen [facility] 2/9 06/22/2018 06/21/2018 11/27/2017 09/29/2016 03/13/2016  Decreased Interest 0 0 0 0 0  Down, Depressed, Hopeless 0 0 0 0 0  PHQ - 2 Score 0 0 0 0 0     Depression Severity and Treatment Recommendations:  0-4= None  5-9= Mild / Treatment:  Support, educate to call if worse; return in one month  10-14= Moderate / Treatment: Support, watchful waiting; Antidepressant or Psycotherapy  15-19= Moderately severe / Treatment: Antidepressant OR Psychotherapy  >= 20 = Major depression, severe / Antidepressant AND Psychotherapy    Review of Systems   Review of Systems  Constitutional: Negative for chills, fever and weight loss.  HENT: Negative for congestion, hearing loss and sinus pain.   Respiratory: Negative for cough, sputum production and shortness of breath.   Cardiovascular: Negative for chest pain and palpitations.  Gastrointestinal: Negative for abdominal pain, heartburn, nausea and vomiting.  Genitourinary: Negative for dysuria, frequency and urgency.  Skin: Negative for itching and rash.       +skin tags  Neurological: Negative for dizziness, tingling and headaches.  Psychiatric/Behavioral: Negative for depression. The patient is not nervous/anxious.     See HPI for ROS as well.    Objective:   Vitals:   06/22/18 0908  BP: 116/77  Pulse: 90  Resp: 18  Temp: 97.9 F (36.6 C)  TempSrc: Oral  SpO2: 97%  Weight: 219 lb 3.2 oz (99.4 kg)  Height: 5\' 6"  (1.676 m)    Body mass index is 35.38 kg/m.  Physical Exam  Constitutional: He is oriented to person, place, and time. He appears well-developed and well-nourished.  HENT:  Head: Normocephalic and atraumatic.  Eyes: Conjunctivae and EOM are normal.  Neck: Normal range of motion. Neck supple. No thyromegaly present.  Cardiovascular: Normal rate, regular rhythm and normal heart sounds.  No murmur heard. Pulmonary/Chest: Effort normal and breath sounds normal. No stridor. No respiratory distress. He has no wheezes.  Abdominal: Soft. Bowel sounds are normal. He exhibits no distension and no mass. There is no tenderness. There is no guarding.  Musculoskeletal: Normal range of motion. He exhibits no edema.       Arms: Neurological: He is alert and oriented to  person, place, and time.  Skin: Skin is warm. Capillary refill takes less than 2 seconds.  Psychiatric: He has a normal mood and affect. His behavior is normal. Judgment and thought content normal.   Skin tags- 2 removed from mid axillary line on right Torso - one tag on each side  Neck- skin tag right lateral     Verbal consent given and  Assessment/Plan:   Patient was seen for a health maintenance exam.  Counseled the patient on health maintenance issues. Reviewed her health mainteance schedule and ordered appropriate tests (see orders.) Counseled on regular exercise and weight management. Recommend regular  eye exams and dental cleaning.   The following issues were addressed today for health maintenance:   Aundrea was seen today for annual exam.  Diagnoses and all orders for this visit:  Encounter for health maintenance examination- discussed age appropriate screenings  Medication monitoring encounter -     Comprehensive metabolic panel -     TestT+TestF+SHBG -     CBC  Screening, lipid -     Comprehensive metabolic panel -     Lipid panel  Screening for diabetes mellitus -     Hemoglobin A1c  Family history of diabetes mellitus -     Hemoglobin A1c  Encounter for monitoring testosterone replacement therapy  Skin tag- removed 5 skin tags today without complications -     lidocaine-EPINEPHrine (XYLOCAINE W/EPI) 1 %-1:100000 (with pres) injection 3 mL    Return in about 1 year (around 06/23/2019).    Body mass index is 35.38 kg/m.:  Discussed the patient's BMI with patient. The BMI body mass index is 35.38 kg/m.     No future appointments.  Patient Instructions   [email or web address]    If you have lab work done today you will be contacted with your lab results within the next 2 weeks.  If you have not heard from [place] then please contact us. The fastest way to get your results is to register for My Chart.   IF you received an x-ray today, you will  receive an invoice from [facility]. Please contact [facility] at [phone] with questions or concerns regarding your invoice.   IF you received labwork today, you will receive an invoice from [place]. Please contact [facility] at [phone] with questions or concerns regarding your invoice.   Our billing staff will not be able to assist you with questions regarding bills from these companies.  You will be contacted with the lab results as soon as they are available. The fastest way to get your results is to activate your My Chart account. Instructions are located on the last page of this paperwork. If you have not heard from [place] regarding the results in 2 weeks, please contact this office.

## 2018-06-22 NOTE — Patient Instructions (Addendum)
[email or web address]    If you have lab work done today you will be contacted with your lab results within the next 2 weeks.  If you have not heard from [place] then please contact us. The fastest way to get your results is to register for My Chart.   IF you received an x-ray today, you will receive an invoice from [facility]. Please contact [facility] Radiology at [phone] with questions or concerns regarding your invoice.   IF you received labwork today, you will receive an invoice from [place]. Please contact [facility] at [phone] with questions or concerns regarding your invoice.   Our billing staff will not be able to assist you with questions regarding bills from these companies.  You will be contacted with the lab results as soon as they are available. The fastest way to get your results is to activate your My Chart account. Instructions are located on the last page of this paperwork. If you have not heard from [place] regarding the results in 2 weeks, please contact this office.

## 2018-06-26 LAB — TESTT+TESTF+SHBG
Sex Hormone Binding: 8.9 nmol/L — ABNORMAL LOW (ref 16.5–55.9)
TESTOSTERONE FREE: 37.5 pg/mL — AB (ref 6.8–21.5)
TESTOSTERONE, TOTAL: 1200.9 ng/dL — AB (ref 264.0–916.0)

## 2018-06-26 LAB — COMPREHENSIVE METABOLIC PANEL
A/G RATIO: 1.4 (ref 1.2–2.2)
ALK PHOS: 79 IU/L (ref 39–117)
ALT: 69 IU/L — ABNORMAL HIGH (ref 0–44)
AST: 61 IU/L — AB (ref 0–40)
Albumin: 4.5 g/dL (ref 3.5–5.5)
BILIRUBIN TOTAL: 0.5 mg/dL (ref 0.0–1.2)
BUN/Creatinine Ratio: 9 (ref 9–20)
BUN: 13 mg/dL (ref 6–24)
CHLORIDE: 98 mmol/L (ref 96–106)
CO2: 23 mmol/L (ref 20–29)
Calcium: 9.3 mg/dL (ref 8.7–10.2)
Creatinine, Ser: 1.38 mg/dL — ABNORMAL HIGH (ref 0.76–1.27)
GFR calc non Af Amer: 61 mL/min/{1.73_m2} (ref >59)
GFR, EST AFRICAN AMERICAN: 71 mL/min/{1.73_m2} (ref >59)
Globulin, Total: 3.2 g/dL (ref 1.5–4.5)
Glucose: 93 mg/dL (ref 65–99)
POTASSIUM: 4.6 mmol/L (ref 3.5–5.2)
Sodium: 139 mmol/L (ref 134–144)
TOTAL PROTEIN: 7.7 g/dL (ref 6.0–8.5)

## 2018-06-26 LAB — CBC
Hematocrit: 58 % — ABNORMAL HIGH (ref 37.5–51.0)
Hemoglobin: 19.5 g/dL — ABNORMAL HIGH (ref 13.0–17.7)
MCH: 30 pg (ref 26.6–33.0)
MCHC: 33.6 g/dL (ref 31.5–35.7)
MCV: 89 fL (ref 79–97)
PLATELETS: 199 10*3/uL (ref 150–450)
RBC: 6.5 x10E6/uL — AB (ref 4.14–5.80)
RDW: 12.9 % (ref 12.3–15.4)
WBC: 6.2 10*3/uL (ref 3.4–10.8)

## 2018-06-26 LAB — LIPID PANEL
CHOLESTEROL TOTAL: 174 mg/dL (ref 100–199)
Chol/HDL Ratio: 5 ratio (ref 0.0–5.0)
HDL: 35 mg/dL — ABNORMAL LOW (ref >39)
LDL Calculated: 116 mg/dL — ABNORMAL HIGH (ref 0–99)
TRIGLYCERIDES: 116 mg/dL (ref 0–149)
VLDL Cholesterol Cal: 23 mg/dL (ref 5–40)

## 2018-06-26 LAB — HEMOGLOBIN A1C
Est. average glucose Bld gHb Est-mCnc: 108 mg/dL
Hgb A1c MFr Bld: 5.4 % (ref 4.8–5.6)

## 2018-07-12 ENCOUNTER — Encounter: Payer: Self-pay | Admitting: Family Medicine

## 2018-11-19 ENCOUNTER — Other Ambulatory Visit: Payer: Self-pay | Admitting: Urgent Care

## 2018-12-06 ENCOUNTER — Other Ambulatory Visit: Payer: Self-pay | Admitting: Urgent Care

## 2018-12-06 DIAGNOSIS — J4520 Mild intermittent asthma, uncomplicated: Secondary | ICD-10-CM

## 2018-12-15 ENCOUNTER — Other Ambulatory Visit: Payer: Self-pay | Admitting: Urgent Care

## 2019-02-04 ENCOUNTER — Other Ambulatory Visit: Payer: Self-pay | Admitting: Family Medicine

## 2019-02-04 DIAGNOSIS — J4520 Mild intermittent asthma, uncomplicated: Secondary | ICD-10-CM

## 2019-02-04 NOTE — Telephone Encounter (Signed)
Requested Prescriptions  Pending Prescriptions Disp Refills  . albuterol (VENTOLIN HFA) 108 (90 Base) MCG/ACT inhaler [Pharmacy Med Name: VENTOLIN HFA 90 MCG INHALER] 18 Inhaler 1    Sig: INHALE 2 PUFFS INTO THE LUNGS EVERY 6 HOURS AS NEEDED     Pulmonology:  Beta Agonists Failed - 02/04/2019 12:11 AM      Failed - One inhaler should last at least one month. If the patient is requesting refills earlier, contact the patient to check for uncontrolled symptoms.      Passed - Valid encounter within last 12 months    Recent Outpatient Visits          7 months ago Encounter for health maintenance examination   Primary Care at [facility], Zoe A, MD   7 months ago OSA (obstructive sleep apnea)   Primary Care at [facility], Manus Rudd, MD   1 year ago Cough   Primary Care at [facility], [place], [place]   2 years ago Mild intermittent extrinsic asthma without complication   Primary Care at [facility], [place], [place]   2 years ago Decreased energy   Primary Care at [facility], [place], [place]

## 2019-08-19 ENCOUNTER — Other Ambulatory Visit: Payer: Self-pay | Admitting: Family Medicine

## 2019-10-17 ENCOUNTER — Encounter: Payer: Self-pay | Admitting: Family Medicine

## 2019-10-17 NOTE — Telephone Encounter (Signed)
Called patient and left a detailed message.

## 2019-11-19 IMAGING — DX DG CHEST 2V
2 series · 2 of 2 positions shown · non-contrast
Comparison: No recent prior.

CLINICAL DATA: Cough.  Shortness of breath.

EXAM:
CHEST - 2 VIEW

[chest pa]
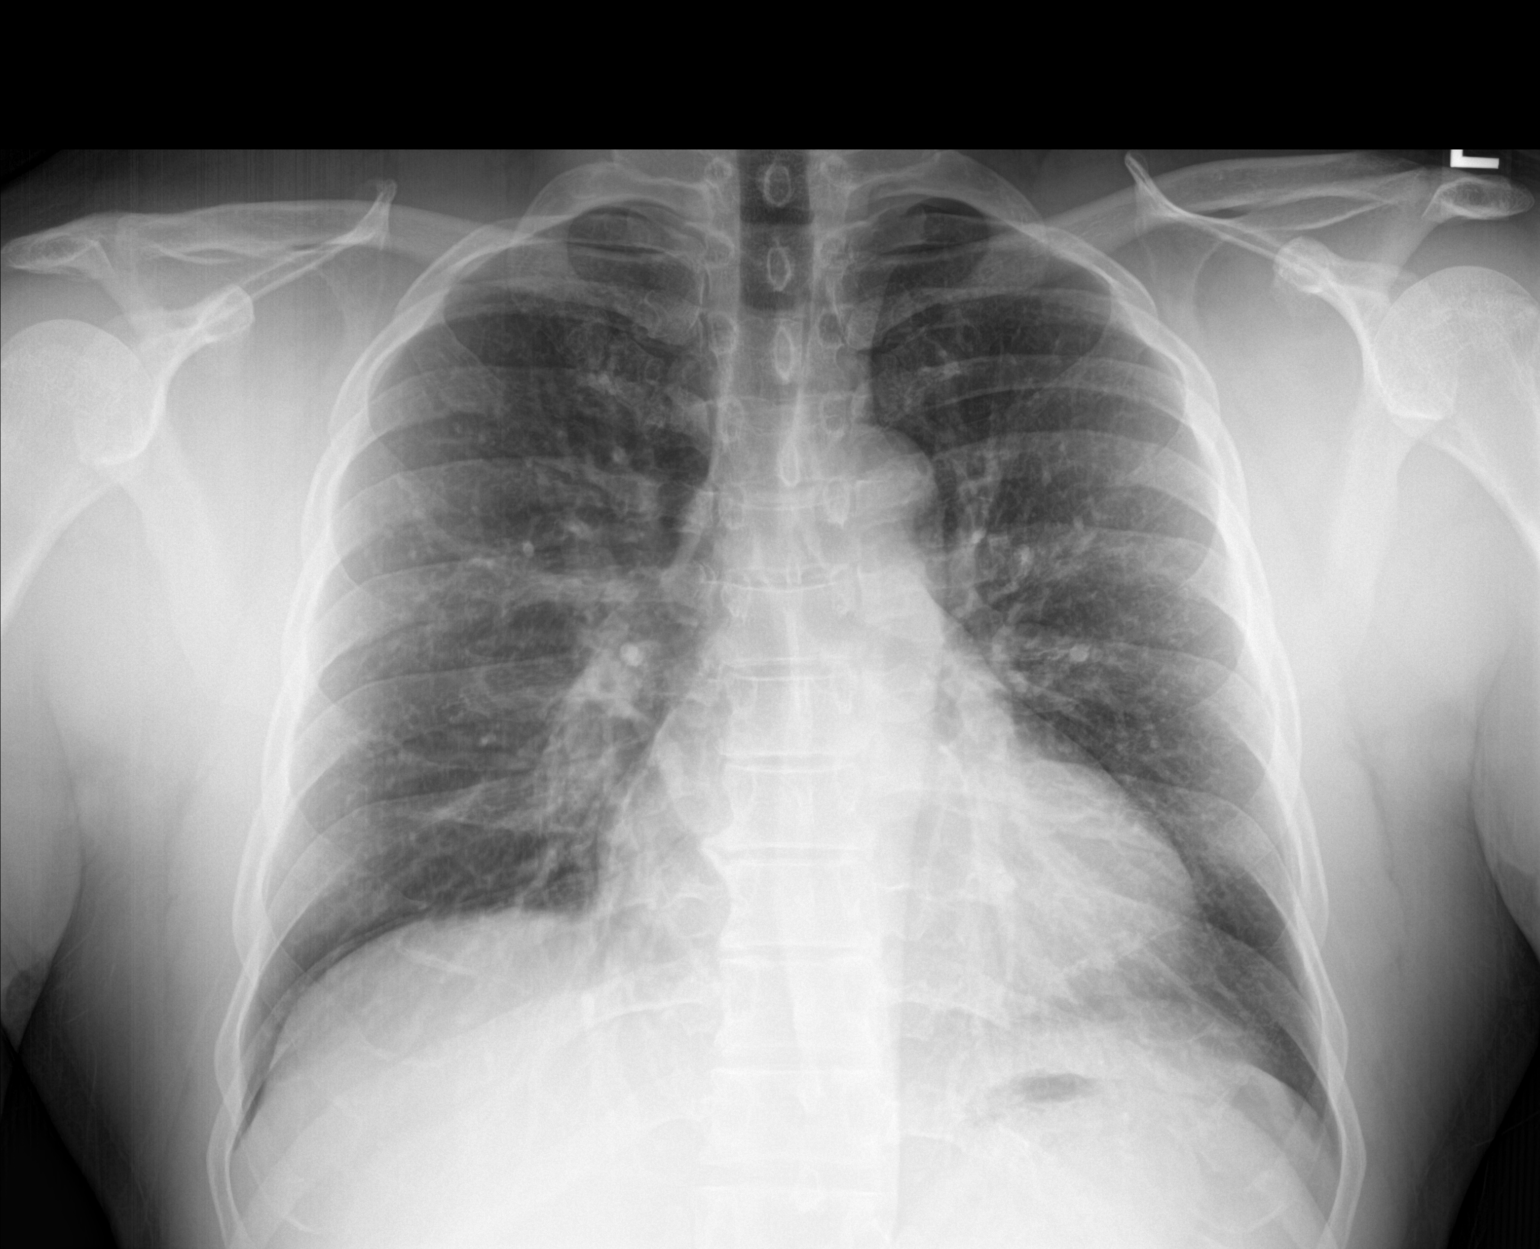

[chest lat]
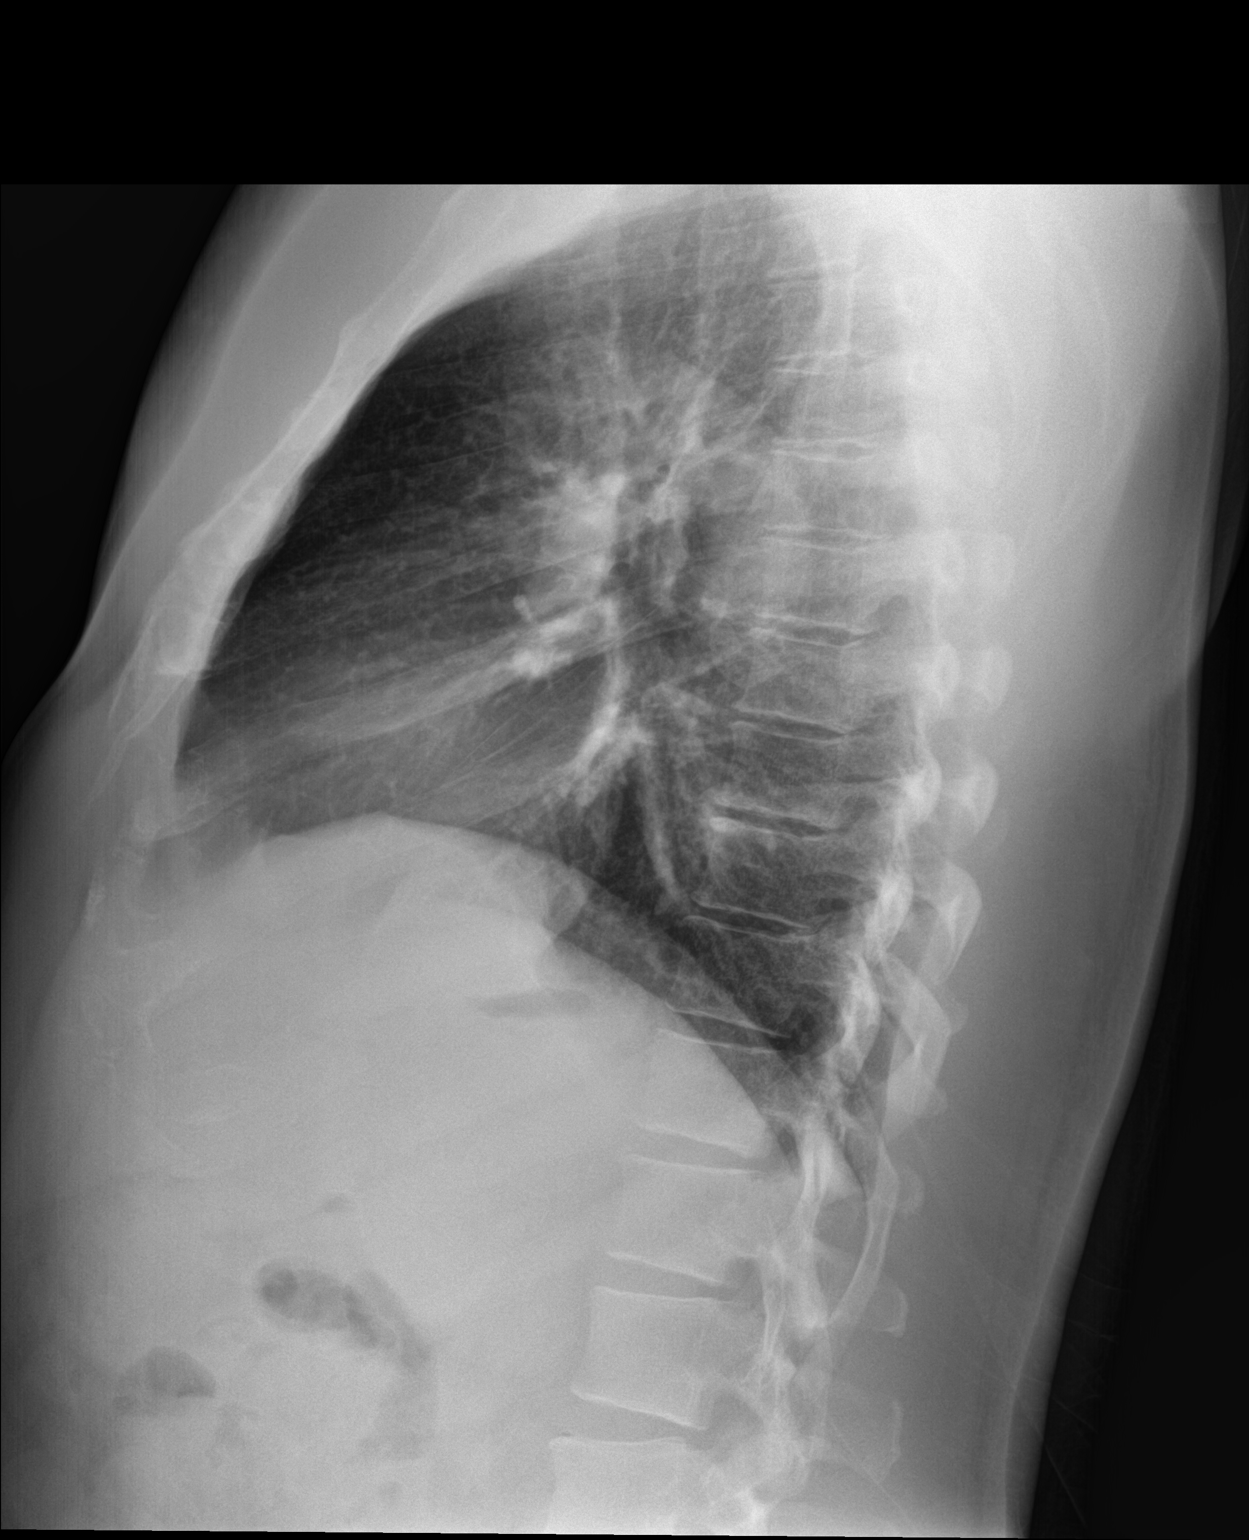

[2 of 2 positions shown; findings below may reference images not displayed]

FINDINGS: Mediastinum and hilar structures normal. Heart size normal. No focal
infiltrate. No pleural effusion or pneumothorax.
IMPRESSION: No acute cardiopulmonary disease.

## 2020-04-10 ENCOUNTER — Other Ambulatory Visit: Payer: Self-pay | Admitting: Family Medicine

## 2020-04-10 NOTE — Telephone Encounter (Signed)
Requested medication (s) are due for refill today: no  Requested medication (s) are on the active medication list: yes  Last refill:  08/19/2019  Future visit scheduled: no  Notes to clinic:  Patient last seen by Dr. Creta Levin Due for follow up   Requested Prescriptions  Pending Prescriptions Disp Refills   montelukast (SINGULAIR) 10 MG tablet 90 tablet 1    Sig: TAKE 1 TABLET BY MOUTH EVERYDAY AT BEDTIME      Pulmonology:  Leukotriene Inhibitors Failed - 04/10/2020  1:36 PM      Failed - Valid encounter within last 12 months    Recent Outpatient Visits           1 year ago Encounter for health maintenance examination   Primary Care at [facility], Manus Rudd, MD   1 year ago OSA (obstructive sleep apnea)   [facility] at [facility], Manus Rudd, MD   2 years ago Cough   Primary Care at [facility], [place], [place]   3 years ago Mild intermittent extrinsic asthma without complication   Primary Care at [facility], [place], [place]   4 years ago Decreased energy   Primary Care at [facility], [place], [place]

## 2020-04-10 NOTE — Telephone Encounter (Signed)
Copied from CRM [id number]. Topic: Quick Communication - Rx Refill/Question >> Apr 10, 2020  1:32 PM Jaquita Rector A wrote: Medication: montelukast (SINGULAIR) 10 MG tablet  Has the patient contacted their pharmacy? Yes.   (Agent: If no, request that the patient contact the pharmacy for the refill.) (Agent: If yes, when and what did the pharmacy advise?)  Preferred Pharmacy (with phone number or street name): [id number] - [place], [place] - [address]  Phone:  [phone] Fax:  [phone]     Agent: Please be advised that RX refills may take up to 3 business days. We ask that you follow-up with your pharmacy.

## 2020-04-24 ENCOUNTER — Other Ambulatory Visit: Payer: Self-pay | Admitting: Family Medicine

## 2020-04-24 NOTE — Telephone Encounter (Signed)
Medication Refill - Medication: montelukast (SINGULAIR) 10 MG tablet   Has the patient contacted their pharmacy? yes (Agent: If no, request that the patient contact the pharmacy for the refill.) (Agent: If yes, when and what did the pharmacy advise?)Contact PCP  Preferred Pharmacy (with phone number or street name):  [id number] - [place], [place] - [address] Phone:  [phone]  Fax:  [phone]       Agent: Please be advised that RX refills may take up to 3 business days. We ask that you follow-up with your pharmacy.

## 2020-04-24 NOTE — Telephone Encounter (Signed)
Please see if pt wants to f/u with [place] or is he following Dr. Creta Levin? He will need a OV before we can refill any medications.   Thanks

## 2020-04-25 MED ORDER — MONTELUKAST SODIUM 10 MG PO TABS: ORAL_TABLET | ORAL | 1 refills | Status: DC

## 2020-04-25 NOTE — Telephone Encounter (Signed)
04/25/2020 - PATIENT REQUESTING A REFILL ON HIS MONTELUKAST (SINGULAIR) 10 mg. I HAVE SCHEDULED HIM A TRANSFER OF CARE FROM DR. STALLINGS TO [facility] ON WED. 06/27/2020 AT 10:50 am. I WILL ROUTE MESSAGE BACK TO THE CLINICAL TEAM FOR PATIENT TO GET COURTESY REFILLS IF POSSIBLE.

## 2020-06-27 ENCOUNTER — Encounter: Payer: BLUE CROSS/BLUE SHIELD | Admitting: Registered Nurse

## 2020-10-11 ENCOUNTER — Encounter: Payer: Self-pay | Admitting: Family Medicine

## 2020-10-11 ENCOUNTER — Other Ambulatory Visit: Payer: Self-pay

## 2020-10-11 ENCOUNTER — Ambulatory Visit (INDEPENDENT_AMBULATORY_CARE_PROVIDER_SITE_OTHER): Payer: 59 | Admitting: Family Medicine

## 2020-10-11 VITALS — BP 138/88 | HR 86 | Temp 97.9°F | Ht 66.0 in | Wt 233.2 lb

## 2020-10-11 DIAGNOSIS — E6609 Other obesity due to excess calories: Secondary | ICD-10-CM

## 2020-10-11 DIAGNOSIS — G4733 Obstructive sleep apnea (adult) (pediatric): Secondary | ICD-10-CM | POA: Diagnosis not present

## 2020-10-11 DIAGNOSIS — J4520 Mild intermittent asthma, uncomplicated: Secondary | ICD-10-CM | POA: Diagnosis not present

## 2020-10-11 DIAGNOSIS — D751 Secondary polycythemia: Secondary | ICD-10-CM | POA: Diagnosis not present

## 2020-10-11 DIAGNOSIS — Z6837 Body mass index (BMI) 37.0-37.9, adult: Secondary | ICD-10-CM

## 2020-10-11 DIAGNOSIS — J45909 Unspecified asthma, uncomplicated: Secondary | ICD-10-CM | POA: Insufficient documentation

## 2020-10-11 MED ORDER — ALBUTEROL SULFATE HFA 108 (90 BASE) MCG/ACT IN AERS: INHALATION_SPRAY | RESPIRATORY_TRACT | 1 refills | Status: AC

## 2020-10-11 MED ORDER — FLUTICASONE PROPIONATE 50 MCG/ACT NA SUSP: NASAL | 5 refills | Status: AC

## 2020-10-11 MED ORDER — MONTELUKAST SODIUM 10 MG PO TABS: ORAL_TABLET | ORAL | 1 refills | Status: AC

## 2020-10-11 NOTE — Progress Notes (Signed)
Patient ID: Anthony [name], male    DOB: 12/01/1970  Age: 50 y.o. MRN: [id number]  Chief Complaint  Patient presents with  . Medical Management of Chronic Issues    And refills     Subjective:   Patient is here for a follow-up with regard to his need for medications.  He has a history of asthma and has done well on the Singulair.  He wants inhalers to use as needed.  He has a history of sleep apnea but has never used CPAP.  He decided not to.  The breathing medicines helped him some, but he still snores a lot.  His wife does not describe apnea and he does not feel like he wakes up gasping for air like he used to.  Labs previously showed mild polycythemia, mild liver enzyme elevation, and a slightly high creatinine.  We will need to follow-up on these things.     Current allergies, medications, problem list, past/family and social histories reviewed.  Objective:  BP 138/88   Pulse 86   Temp 97.9 F (36.6 C) (Temporal)   Ht 5\' 6"  (1.676 m)   Wt 233 lb 3.2 oz (105.8 kg)   SpO2 96%   BMI 37.64 kg/m   Chest clear.  Heart rate without murmur.  Obesity is noted.  Assessment & Plan:   Assessment: 1. Mild intermittent extrinsic asthma without complication   2. Obstructive sleep apnea syndrome   3. Class 2 obesity due to excess calories without serious comorbidity with body mass index (BMI) of 37.0 to 37.9 in adult   4. Polycythemia, secondary       Plan: He declined a flu shot today.  Encouraged him to lose weight and get regular exercise.  Encouraged him to reconsider on sleep apnea.  He dozes easily in the daytime.  Orders Placed This Encounter  Procedures  . CBC  . Lipid panel  . Hemoglobin A1c  . CMP14+EGFR    Meds ordered this encounter  Medications  . albuterol (VENTOLIN HFA) 108 (90 Base) MCG/ACT inhaler    Sig: INHALE 2 PUFFS INTO THE LUNGS EVERY 6 HOURS AS NEEDED    Dispense:  18 each    Refill:  1  . montelukast (SINGULAIR) 10 MG tablet    Sig: TAKE 1  TABLET BY MOUTH EVERYDAY AT BEDTIME    Dispense:  90 tablet    Refill:  1  . fluticasone (FLONASE) 50 MCG/ACT nasal spray    Sig: SPRAY 2 SPRAYS INTO EACH NOSTRIL EVERY DAY    Dispense:  16 g    Refill:  5    Office visit needed for additional refills.         Patient Instructions   Continue your current medications  I encourage you to consider getting your flu shot.  I commend you on having gotten your Covid vaccinations.  I urged you to get regular exercise and to try and eat less to lose some weight.  Reconsider the question of sleep apnea treatment with CPAP.  We will let you know the results of your laboratory testing.  Especially wish to see the results on your kidney function and your hemoglobin level and your liver enzymes.  You need to plug-in with a new primary care physician since this practice is closing.    If you have lab work done today you will be contacted with your lab results within the next 2 weeks.  If you have not heard from then please contact  us. The fastest way to get your results is to register for My Chart.   IF you received an x-ray today, you will receive an invoice from [facility]. Please contact [facility]iology at [phone] with questions or concerns regarding your invoice.   IF you received labwork today, you will receive an invoice from [place]. Please contact [facility] at [phone] with questions or concerns regarding your invoice.   Our billing staff will not be able to assist you with questions regarding bills from these companies.  You will be contacted with the lab results as soon as they are available. The fastest way to get your results is to activate your My Chart account. Instructions are located on the last page of this paperwork. If you have not heard from [place] regarding the results in 2 weeks, please contact this office.        Return if symptoms worsen or fail to improve.   Ruben Reason, MD  10/11/2020

## 2020-10-11 NOTE — Patient Instructions (Addendum)
Continue your current medications  I encourage you to consider getting your flu shot.  I commend you on having gotten your Covid vaccinations.  I urged you to get regular exercise and to try and eat less to lose some weight.  Reconsider the question of sleep apnea treatment with CPAP.  We will let you know the results of your laboratory testing.  Especially wish to see the results on your kidney function and your hemoglobin level and your liver enzymes.  You need to plug-in with a new primary care physician since this practice is closing.    If you have lab work done today you will be contacted with your lab results within the next 2 weeks.  If you have not heard from [place] then please contact us. The fastest way to get your results is to register for My Chart.   IF you received an x-ray today, you will receive an invoice from [facility]. Please contact [facility]iology at [phone] with questions or concerns regarding your invoice.   IF you received labwork today, you will receive an invoice from [place]. Please contact [facility] at [phone] with questions or concerns regarding your invoice.   Our billing staff will not be able to assist you with questions regarding bills from these companies.  You will be contacted with the lab results as soon as they are available. The fastest way to get your results is to activate your My Chart account. Instructions are located on the last page of this paperwork. If you have not heard from [place] regarding the results in 2 weeks, please contact this office.

## 2020-10-12 LAB — CMP14+EGFR
ALT: 49 IU/L — ABNORMAL HIGH (ref 0–44)
AST: 27 IU/L (ref 0–40)
Albumin/Globulin Ratio: 1.6 (ref 1.2–2.2)
Albumin: 4.4 g/dL (ref 4.0–5.0)
Alkaline Phosphatase: 100 IU/L (ref 44–121)
BUN/Creatinine Ratio: 10 (ref 9–20)
BUN: 12 mg/dL (ref 6–24)
Bilirubin Total: 0.3 mg/dL (ref 0.0–1.2)
CO2: 23 mmol/L (ref 20–29)
Calcium: 9.2 mg/dL (ref 8.7–10.2)
Chloride: 104 mmol/L (ref 96–106)
Creatinine, Ser: 1.15 mg/dL (ref 0.76–1.27)
GFR calc Af Amer: 87 mL/min/{1.73_m2} (ref >59)
GFR calc non Af Amer: 75 mL/min/{1.73_m2} (ref >59)
Globulin, Total: 2.7 g/dL (ref 1.5–4.5)
Glucose: 108 mg/dL — ABNORMAL HIGH (ref 65–99)
Potassium: 4.7 mmol/L (ref 3.5–5.2)
Sodium: 142 mmol/L (ref 134–144)
Total Protein: 7.1 g/dL (ref 6.0–8.5)

## 2020-10-12 LAB — CBC
Hematocrit: 50.2 % (ref 37.5–51.0)
Hemoglobin: 17.1 g/dL (ref 13.0–17.7)
MCH: 30.3 pg (ref 26.6–33.0)
MCHC: 34.1 g/dL (ref 31.5–35.7)
MCV: 89 fL (ref 79–97)
Platelets: 187 10*3/uL (ref 150–450)
RBC: 5.64 x10E6/uL (ref 4.14–5.80)
RDW: 12.3 % (ref 11.6–15.4)
WBC: 4.3 10*3/uL (ref 3.4–10.8)

## 2020-10-12 LAB — HEMOGLOBIN A1C
Est. average glucose Bld gHb Est-mCnc: 123 mg/dL
Hgb A1c MFr Bld: 5.9 % — ABNORMAL HIGH (ref 4.8–5.6)

## 2020-10-12 LAB — LIPID PANEL
Chol/HDL Ratio: 4.5 ratio (ref 0.0–5.0)
Cholesterol, Total: 168 mg/dL (ref 100–199)
HDL: 37 mg/dL — ABNORMAL LOW (ref >39)
LDL Chol Calc (NIH): 97 mg/dL (ref 0–99)
Triglycerides: 195 mg/dL — ABNORMAL HIGH (ref 0–149)
VLDL Cholesterol Cal: 34 mg/dL (ref 5–40)

## 2020-12-16 ENCOUNTER — Other Ambulatory Visit: Payer: Self-pay | Admitting: Family Medicine

## 2020-12-16 DIAGNOSIS — J4520 Mild intermittent asthma, uncomplicated: Secondary | ICD-10-CM

## 2020-12-16 DIAGNOSIS — E6609 Other obesity due to excess calories: Secondary | ICD-10-CM

## 2020-12-16 DIAGNOSIS — G4733 Obstructive sleep apnea (adult) (pediatric): Secondary | ICD-10-CM

## 2020-12-16 DIAGNOSIS — Z6837 Body mass index (BMI) 37.0-37.9, adult: Secondary | ICD-10-CM

## 2022-06-02 ENCOUNTER — Ambulatory Visit (INDEPENDENT_AMBULATORY_CARE_PROVIDER_SITE_OTHER): Payer: BLUE CROSS/BLUE SHIELD | Admitting: Neurology

## 2022-06-02 ENCOUNTER — Encounter: Payer: Self-pay | Admitting: *Deleted

## 2022-06-02 ENCOUNTER — Encounter: Payer: Self-pay | Admitting: Neurology

## 2022-06-02 VITALS — BP 136/83 | HR 116 | Ht 66.0 in | Wt 236.0 lb

## 2022-06-02 DIAGNOSIS — G4733 Obstructive sleep apnea (adult) (pediatric): Secondary | ICD-10-CM | POA: Diagnosis not present

## 2022-06-02 DIAGNOSIS — R635 Abnormal weight gain: Secondary | ICD-10-CM

## 2022-06-02 DIAGNOSIS — R351 Nocturia: Secondary | ICD-10-CM

## 2022-06-02 DIAGNOSIS — E669 Obesity, unspecified: Secondary | ICD-10-CM

## 2022-06-02 NOTE — Patient Instructions (Signed)
It was nice to see you again today, as discussed, based on your symptoms and your exam I believe you are still at risk for obstructive sleep apnea (aka OSA). We should proceed with a sleep study to determine whether you do or do not have OSA and how severe it is.   As explained, an attended sleep study (meaning you get to stay overnight in the sleep lab), lets [place] monitor sleep-related behaviors such as sleep talking and leg movements in sleep, in addition to monitoring for sleep apnea.  A home sleep test is a screening tool for sleep apnea diagnosis only, but unfortunately, does not help with any other sleep-related diagnoses.  Please remember, the long-term risks and ramifications of untreated moderate to severe obstructive sleep apnea may include (but are not limited to): increased risk for cardiovascular disease, including congestive heart failure, stroke, difficult to control hypertension, treatment resistant obesity, arrhythmias, especially irregular heartbeat commonly known as A. Fib. (atrial fibrillation); even type 2 diabetes has been linked to untreated OSA.   Other correlations that untreated obstructive sleep apnea include macular edema which is swelling of the retina in the eyes, droopy eyelid syndrome, and elevated hemoglobin and hematocrit levels (often referred to as polycythemia).  Sleep apnea can cause disruption of sleep and sleep deprivation in most cases, which, in turn, can cause recurrent headaches, problems with memory, mood, concentration, focus, and vigilance. Most people with untreated sleep apnea report excessive daytime sleepiness, which can affect their ability to drive. Please do not drive or use heavy equipment or machinery, if you feel sleepy! Patients with sleep apnea can also develop difficulty initiating and maintaining sleep (aka insomnia).   Having sleep apnea may increase your risk for other sleep disorders, including involuntary behaviors sleep such as sleep terrors,  sleep talking, sleepwalking.    Having sleep apnea can also increase your risk for restless leg syndrome and leg movements at night.   Please note that untreated obstructive sleep apnea may carry additional perioperative morbidity. Patients with significant obstructive sleep apnea (typically, in the moderate to severe degree) should receive, if possible, perioperative PAP (positive airway pressure) therapy and the surgeons and particularly the anesthesiologists should be informed of the diagnosis and the severity of the sleep disordered breathing.   We will call you or email you through [place] with regards to your test results and plan a follow-up in sleep clinic accordingly. Most likely, you will hear from one of our nurses.   Our sleep lab administrative assistant will call you to schedule your sleep study and give you further instructions, regarding the check in process for the sleep study, arrival time, what to bring, when you can expect to leave after the study, etc., and to answer any other logistical questions you may have. If you don't hear back from her by about 2 weeks from now, please feel free to call her direct line at [phone] or you can call our general clinic number, or email [place] through My Chart.

## 2022-06-02 NOTE — Progress Notes (Signed)
Subjective:    Patient ID: Anthony [name] is a 51 y.o. male.  HPI    Anthony Age, MD, [name] [facility] [address], [address] [place], [place] [address]  Dear Dr. Francesco [name],   I saw your patient, Anthony [name], upon your kind request in my sleep clinic today for reevaluation of his sleep apnea.  The patient is unaccompanied today.  As you know, Anthony [name] is a 51 year old male with an underlying medical history of asthma, allergies, and obesity, who was previously diagnosed with moderate obstructive sleep apnea.  I had evaluated him in 2019.  Home sleep test on 02/01/2018 showed moderate obstructive sleep apnea with an AHI of 24/h, O2 nadir 80% with time below 89% saturation of 30 minutes for the night.  He did not pursue CPAP therapy at the time and was lost to follow-up since then.  He reports ongoing issues with daytime somnolence and since his job changed he has also gained weight.  He does see a wellness doctor who has prescribed metformin and his testosterone injections which he takes twice a week.  Patient often falls asleep on the couch in the evening.  Bedtime is generally between 9 and 10 PM, rise time around 6:30 AM.  He lives with his wife and 3 of his 4 kids, his 62 year old his on his own, he has a 49 year old at home, a 54 year old daughter and a 55 year old son.  He has caffeine in the form of coffee, usually 1 cup in the morning and occasional energy drink.  He is a non-smoker and drinks alcohol very rarely, maybe special occasions only.  He manages cemeteries, a total of 6 in different locations and has to drive quite a bit for his job.  They have several pets in the household.  He has nocturia about once per average night and denies recurrent morning or nocturnal headaches.  He is not aware of any family history of sleep apnea. His Epworth sleepiness score is 14 out of 24, fatigue severity score is 27 out of 63.  His wife has regularly noticed breathing  pauses while he is asleep.  Previously:  01/18/2018: Anthony [name] is a 51 year old right-handed gentleman with an underlying medical history of asthma, allergies, nocturnal cough and dyspnea and obesity, who reports snoring and excessive daytime somnolence. I reviewed your office note from 11/27/2017. His Epworth sleepiness score is 14 out of 24 today, fatigue score is 1963. He reports waking up with a sense of gasping for air and sleep disruption with waking up several times in the middle of the night. He lives with his wife and his children, they have 4 children. He drinks alcohol rarely, caffeine daily, he is a nonsmoker.His father snores but could not go through with a sleep study as I understand. Patient reports a bedtime of around 9 PM and does not have trouble going to sleep but trouble staying asleep. He denies telltale symptoms of restless leg syndrome but is restless at times. He works as a Building control surveyor. He gets about 3-4 hours consolidated sleep and after that it is disrupted. He does not have recurrent morning headaches and does not have night to night nocturia. His wife has noted positives in his breathing while he is asleep. They have several pets in the household which includes one dog and 4 cats.   His Past Medical History Is Significant For: Past Medical History:  Diagnosis Date   Allergy  Asthma     His Past Surgical History Is Significant For: Past Surgical History:  Procedure Laterality Date   VASECTOMY      His Family History Is Significant For: Family History  Problem Relation [name] of Onset   Epilepsy Mother    Cancer Mother    Diabetes Father    Sleep apnea Father    Alcohol abuse Maternal Grandfather    Diabetes Paternal Grandmother     His Social History Is Significant For: Social History   Socioeconomic History   Marital status: Married    Spouse name: Not on file   Number of children: Not on file   Years of education: Not  on file   Highest education level: Not on file  Occupational History   Not on file  Tobacco Use   Smoking status: Never   Smokeless tobacco: Never  Substance and Sexual Activity   Alcohol use: No   Drug use: No   Sexual activity: Yes    Comment: Vasectomy  Other Topics Concern   Not on file  Social History Narrative   Not on file   Social Determinants of Health   Financial Resource Strain: Not on file  Food Insecurity: Not on file  Transportation Needs: Not on file  Physical Activity: Not on file  Stress: Not on file  Social Connections: Not on file    His Allergies Are:  Allergies  Allergen Reactions   Aspirin Shortness Of Breath  :   His Current Medications Are:  Outpatient Encounter Medications as of 06/02/2022  Medication Sig   ADVAIR DISKUS 250-50 MCG/ACT AEPB Inhale 1 puff into the lungs 2 (two) times daily.   albuterol (VENTOLIN HFA) 108 (90 Base) MCG/ACT inhaler INHALE 2 PUFFS INTO THE LUNGS EVERY 6 HOURS AS NEEDED   DIGESTIVE ENZYMES PO Take by mouth.   fluticasone (FLONASE) 50 MCG/ACT nasal spray SPRAY 2 SPRAYS INTO EACH NOSTRIL EVERY DAY   metFORMIN (GLUCOPHAGE) 500 MG tablet Take 500 mg by mouth daily.   montelukast (SINGULAIR) 10 MG tablet TAKE 1 TABLET BY MOUTH EVERYDAY AT BEDTIME   Multiple Vitamins-Minerals (MULTIVITAMIN WITH MINERALS) tablet Take 1 tablet by mouth daily.   Omega-3 Fatty Acids (OMEGA 3 PO) Take by mouth.   VITAMIN D PO Take by mouth.   No facility-administered encounter medications on file as of 06/02/2022.  :   Review of Systems:  Out of a complete 14 point review of systems, all are reviewed and negative with the exception of these symptoms as listed below:  Review of Systems  Neurological:        Pt here for sleep consult   Pt snore,fatigue,gasping for air during the night Pt denies hypertension CPAP machine ,headaches Pt states sleep study done 4 years ago Pt states recommended CPAP machine but didn't  want the CPAP machine and  didn't want to pay for machine   ESS:14 FSS:27      Objective:  Neurological Exam  Physical Exam Physical Examination:   Vitals:   06/02/22 1410  BP: 136/83  Pulse: (!) 116   General Examination: The patient is a very pleasant 51 y.o. male in no acute distress. He appears well-developed and well-nourished and well groomed.   HEENT: Normocephalic, atraumatic, pupils are equal, round and reactive to light, extraocular tracking is good without limitation to gaze excursion or nystagmus noted. Hearing is grossly intact. Face is symmetric with normal facial animation. Speech is clear with no dysarthria noted. There is no hypophonia.  There is no lip, neck/head, jaw or voice tremor. Neck is supple with full range of passive and active motion. There are no carotid bruits on auscultation. Oropharynx exam reveals: mild mouth dryness, good dental hygiene and moderate airway crowding, due to Mallampati class II, tonsillar size of 1-2+, right side easier to see than left, slightly bigger than left, neck circumference of 18 inches.  Minimal to mild overbite.  Tongue protrudes centrally and palate elevates symmetrically.  Chest: Clear to auscultation without wheezing, rhonchi or crackles noted.  Heart: S1+S2+0, regular and normal without murmurs, rubs or gallops noted.   Abdomen: Soft, non-tender and non-distended.  Extremities: There is no obvious edema in the distal lower extremities bilaterally.   Skin: Warm and dry without trophic changes noted.   Musculoskeletal: exam reveals no obvious joint deformities.   Neurologically:  Mental status: The patient is awake, alert and oriented in all 4 spheres. His immediate and remote memory, attention, language skills and fund of knowledge are appropriate. There is no evidence of aphasia, agnosia, apraxia or anomia. Speech is clear with normal prosody and enunciation. Thought process is linear. Mood is normal and affect is normal.  Cranial nerves II -  XII are as described above under HEENT exam.  Motor exam: Normal bulk, strength and tone is noted. There is no obvious action or resting tremor.  Fine motor skills and coordination: grossly intact.  Cerebellar testing: No dysmetria or intention tremor. There is no truncal or gait ataxia.  Sensory exam: intact to light touch in the upper and lower extremities.  Gait, station and balance: He stands easily. No veering to one side is noted. No leaning to one side is noted. Posture is [name]-appropriate and stance is narrow based. Gait shows normal stride length and normal pace. No problems turning are noted.   Assessment and Plan:  In summary, Kayston Jodoin is a very pleasant 51 year old male with an underlying medical history of asthma, allergies, and obesity, who was previously diagnosed with moderate obstructive sleep apnea.  He had a home sleep test in June 2019 which indicated moderate obstructive sleep apnea with an AHI of 24/h, O2 nadir of 80%.  He has had some weight gain in the interim, current BMI 38.1, previously it was 35 when he got tested in the past.    I had a long chat with the patient about my findings and the diagnosis of sleep apnea, particularly OSA, its prognosis and treatment options. We talked about medical/conservative treatments, surgical interventions and non-pharmacological approaches for symptom control. I explained, in particular, the risks and ramifications of untreated moderate to severe OSA, especially with respect to developing cardiovascular disease down the road, including congestive heart failure (CHF), difficult to treat hypertension, cardiac arrhythmias (particularly A-fib), neurovascular complications including TIA, stroke and dementia. Even type 2 diabetes has, in part, been linked to untreated OSA. Symptoms of untreated OSA may include (but may not be limited to) daytime sleepiness, nocturia (i.e. frequent nighttime urination), memory problems, mood irritability and  suboptimally controlled or worsening mood disorder such as depression and/or anxiety, lack of energy, lack of motivation, physical discomfort, as well as recurrent headaches, especially morning or nocturnal headaches. We talked about the importance of maintaining a healthy lifestyle and striving for healthy weight. I recommended the following at this time: sleep study.  I outlined the differences between a laboratory attended sleep study which is considered more comprehensive and accurate over the option of a home sleep test (HST); the latter may  lead to underestimation of sleep disordered breathing in some instances and does not help with diagnosing upper airway resistance syndrome and is not accurate enough to diagnose primary central sleep apnea typically. I explained the different sleep test procedures to the patient in detail and also outlined possible surgical and non-surgical treatment options of OSA, including the use of a pressure airway pressure (PAP) device (ie CPAP, AutoPAP/APAP or BiPAP in certain circumstances), a custom-made dental device (aka oral appliance, which would require a referral to a specialist dentist or orthodontist typically, and is generally speaking not considered a good choice for patients with full dentures or edentulous state), upper airway surgical options, such as traditional UPPP (which is not considered a first-line treatment) or the Inspire device (hypoglossal nerve stimulator, which would involve a referral for consultation with an ENT surgeon, after careful selection, following inclusion criteria). I explained the PAP treatment option to the patient in detail, as this is generally considered first-line treatment.  The patient indicated that he would be willing to try PAP therapy, if the need arises. I explained the importance of being compliant with PAP treatment, not only for insurance purposes but primarily to improve patient's symptoms symptoms, and for the patient's long  term health benefit, including to reduce His cardiovascular risks longer-term.    We will pick up our discussion about the next steps and treatment options after testing.  We will keep him posted as to the test results by phone call and/or MyChart messaging where possible.  We will plan to follow-up in sleep clinic accordingly as well.  I answered all his questions today and the patient was in agreement.   I encouraged him to call with any interim questions, concerns, problems or updates or email [place] through MyChart.  Generally speaking, sleep test authorizations may take up to 2 weeks, sometimes less, sometimes longer, the patient is encouraged to get in touch with [place] if they do not hear back from the sleep lab staff directly within the next 2 weeks.  Thank you very much for allowing me to participate in the care of this nice patient. If I can be of any further assistance to you please do not hesitate to call me at [phone].  Sincerely,   [name], MD, [name]

## 2022-07-10 ENCOUNTER — Telehealth: Payer: Self-pay | Admitting: Neurology

## 2022-07-10 NOTE — Telephone Encounter (Signed)
LVM for pt to call back   insurance will not approve bc we are out of network with insurance

## 2022-07-15 NOTE — Telephone Encounter (Signed)
Left voicemail for pt to call back to schedule.

## 2022-07-15 NOTE — Telephone Encounter (Signed)
Patient returned my call. I informed him with his [employer] we are no in network with his insurance for the sleep study.  He informed me that he is getting new insurance at the beginning of the year. I informed him that he will have to let me know when he get his new insurance because I am not sure if [employer] will approve the in lab because that is what he was choosing.   Right now he is scheduled for 09/28/22 at 8 pm.

## 2022-08-06 DIAGNOSIS — R7303 Prediabetes: Secondary | ICD-10-CM | POA: Diagnosis not present

## 2022-08-06 DIAGNOSIS — G4733 Obstructive sleep apnea (adult) (pediatric): Secondary | ICD-10-CM | POA: Diagnosis not present

## 2022-09-04 NOTE — Telephone Encounter (Signed)
Good afternoon,  I checked the authorization process with your new [employer] insurance and unfortunately they are not approving the in lab.   You have not had a stroke within 30 days, a TIA, Coronary Artery Disease, Sustained supraventricular tachycardic arrhythmias, sustained supraventricular bradycardic arrhythmias, moderate or severe congestive heart failure.  I am going to have to cancel the in lab appointment that is scheduled for 09/28/22.  However, your insurance is approving the home sleep study. We schedule those on Tuesday's were you come by the office to pick up the device. My first available I have is on Tuesday 09/23/22. Would you like a morning or afternoon appointment.  Please let me know what you would like to do.   Thank you, Raquel Sarna

## 2022-09-08 NOTE — Telephone Encounter (Signed)
[employer] Josem Kaufmann: [id number] (exp. 09/04/22 to 11/02/22)  Patient HST is scheduled for 09/23/22 at 1:30 pm.  Sent mychart message to the patient.

## 2022-09-23 ENCOUNTER — Ambulatory Visit: Payer: BC Managed Care – PPO | Admitting: Neurology

## 2022-09-23 DIAGNOSIS — R351 Nocturia: Secondary | ICD-10-CM

## 2022-09-23 DIAGNOSIS — R635 Abnormal weight gain: Secondary | ICD-10-CM

## 2022-09-23 DIAGNOSIS — G4733 Obstructive sleep apnea (adult) (pediatric): Secondary | ICD-10-CM

## 2022-09-23 DIAGNOSIS — E669 Obesity, unspecified: Secondary | ICD-10-CM

## 2022-10-21 ENCOUNTER — Ambulatory Visit: Payer: BC Managed Care – PPO | Admitting: Neurology

## 2022-10-21 DIAGNOSIS — E669 Obesity, unspecified: Secondary | ICD-10-CM

## 2022-10-21 DIAGNOSIS — G4733 Obstructive sleep apnea (adult) (pediatric): Secondary | ICD-10-CM

## 2022-10-21 DIAGNOSIS — R351 Nocturia: Secondary | ICD-10-CM

## 2022-10-21 DIAGNOSIS — R635 Abnormal weight gain: Secondary | ICD-10-CM

## 2022-10-23 NOTE — Progress Notes (Signed)
See procedure note.

## 2022-10-29 ENCOUNTER — Telehealth: Payer: Self-pay | Admitting: *Deleted

## 2022-10-29 ENCOUNTER — Other Ambulatory Visit: Payer: Self-pay | Admitting: Neurology

## 2022-10-29 DIAGNOSIS — G4734 Idiopathic sleep related nonobstructive alveolar hypoventilation: Secondary | ICD-10-CM

## 2022-10-29 DIAGNOSIS — G4733 Obstructive sleep apnea (adult) (pediatric): Secondary | ICD-10-CM

## 2022-10-29 NOTE — Telephone Encounter (Signed)
I called pt. I advised pt that Dr. Rexene Alberts reviewed their sleep study results and found that pt had severe OSA. Dr. Rexene Alberts recommends that pt starts autopap. I reviewed PAP compliance expectations with the pt. Pt is agreeable to starting an auto-PAP. I advised pt that an order will be sent to a DME, [facility], and they will call the pt within about one week after they file with the pt's insurance. They will show the pt how to use the machine, fit for masks, and troubleshoot the auto-PAP if needed. A follow up appt was made for insurance purposes with Dr. Rexene Alberts on 01-12-2023 at 1115. Pt verbalized understanding to arrive 15 minutes early and bring their auto-PAP. A letter with all of this information in it will be mailed to the pt as a reminder. I verified with the pt that the address we have on file is correct. Pt verbalized understanding of results. Pt had no questions at this time but was encouraged to call back if questions arise. I have sent the order to [facility] and have received confirmation that they have received the order.

## 2022-10-29 NOTE — Telephone Encounter (Signed)
Called pt and LVM with office number asking for call back.

## 2022-10-29 NOTE — Telephone Encounter (Signed)
-----   Message from [name], MD sent at 10/29/2022 11:40 AM EST ----- Urgent set up requested on PAP therapy, due to severe OSA.  Patient referred by primary care physician, seen by me on 06/02/2022, patient had a HST on 10/21/2022.    Please call and notify the patient that the recent home sleep test showed obstructive sleep apnea in the severe range. I recommend treatment for this in the form of autoPAP, which means, that we don't have to bring him in for a sleep study with CPAP, but will let him start using a so called autoPAP machine at home, through a DME company (of his choice, or as per insurance requirement). The DME representative will fit the patient with a mask of choice, educate him on how to use the machine, how to put the mask on, etc. I have placed an order in the chart. Please send the order to a local DME, talk to patient, send report to referring MD. Please also reinforce the need for compliance with treatment. We will need a FU in sleep clinic for 10 weeks post-PAP set up, please arrange that with me or one of our NPs. Thanks,   [name], MD, [name] [facility])

## 2022-10-29 NOTE — Procedures (Signed)
[facility]  HOME SLEEP TEST (Watch PAT) REPORT  STUDY DATE: 10/21/2022  DOB: Aug 20, 1971  MRN: [id number]  ORDERING CLINICIAN: [name], MD, [name]   REFERRING CLINICIAN: Sueanne Margarita, DO   CLINICAL INFORMATION/HISTORY: 52 year old male with an underlying medical history of asthma, allergies, and obesity, who presents for reevaluation of his OSA.  He has not been on PAP therapy.  He has had weight gain since his last sleep test.    Epworth sleepiness score: 14/24.  BMI: 38 kg/m  FINDINGS:   Sleep Summary:   Total Recording Time (hours, min): 7 hours, 11 min  Total Sleep Time (hours, min):  6 hours, 13 min  Percent REM (%):    22.4%   Respiratory Indices:   Calculated pAHI (per hour):  61.6/hour         REM pAHI:    68.3/hour       NREM pAHI: 58.8/hour  Central pAHI: 1.4/hour  Oxygen Saturation Statistics:    Oxygen Saturation (%) Mean: 91%   Minimum oxygen saturation (%):                 78%   O2 Saturation Range (%): 78- 99%    O2 Saturation (minutes) <=88%: 54.2 min  Pulse Rate Statistics:   Pulse Mean (bpm):    85/min    Pulse Range (59 - 122/min)   IMPRESSION: OSA (obstructive sleep apnea), severe Nocturnal Hypoxemia  RECOMMENDATION:  This home sleep test demonstrates severe obstructive sleep apnea with a total AHI of 61.1/hour and O2 nadir of 78% with significant time below or at 88% saturation of over 50 minutes, indicating nocturnal hypoxemia.  Snoring was detected, in the moderate to loud range.  Treatment with positive airway pressure is highly recommended. This will require - ideally - a full night CPAP titration study for proper treatment settings, O2 monitoring and mask fitting. For now, the patient will be advised to proceed with an autoPAP titration/trial at home. A laboratory attended titration study can be considered in the future for optimization of treatment settings and to improve tolerance and compliance.  Alternative treatment options are limited secondary to the severity of the patient's sleep disordered breathing, but may include surgical treatment with an implantable hypoglossal nerve stimulator (in carefully selected candidates, meeting criteria).  Concomitant weight loss is recommended, where clinically appropriate. Please note, that untreated obstructive sleep apnea may carry additional perioperative morbidity. Patients with significant obstructive sleep apnea should receive perioperative PAP therapy and the surgeons and particularly the anesthesiologist should be informed of the diagnosis and the severity of the sleep disordered breathing. The patient should be cautioned not to drive, work at heights, or operate dangerous or heavy equipment when tired or sleepy. Review and reiteration of good sleep hygiene measures should be pursued with any patient. Other causes of the patient's symptoms, including circadian rhythm disturbances, an underlying mood disorder, medication effect and/or an underlying medical problem cannot be ruled out based on this test. Clinical correlation is recommended.  The patient and his referring provider will be notified of the test results. The patient will be seen in follow up in sleep clinic at [facility].  I certify that I have reviewed the raw data recording prior to the issuance of this report in accordance with the standards of the American Academy of Sleep Medicine (AASM).    INTERPRETING PHYSICIAN:   [name], MD, [name] Medical Director, [facility]) [place], ABPN (Neurology and Sleep)  [facility] [address], [place], [place] [address] [phone]

## 2022-11-19 DIAGNOSIS — G4733 Obstructive sleep apnea (adult) (pediatric): Secondary | ICD-10-CM | POA: Diagnosis not present

## 2022-12-20 DIAGNOSIS — G4733 Obstructive sleep apnea (adult) (pediatric): Secondary | ICD-10-CM | POA: Diagnosis not present

## 2023-01-12 ENCOUNTER — Encounter: Payer: Self-pay | Admitting: Neurology

## 2023-01-12 ENCOUNTER — Ambulatory Visit (INDEPENDENT_AMBULATORY_CARE_PROVIDER_SITE_OTHER): Payer: BC Managed Care – PPO | Admitting: Neurology

## 2023-01-12 VITALS — BP 143/98 | HR 108 | Ht 66.0 in | Wt 247.8 lb

## 2023-01-12 DIAGNOSIS — G4719 Other hypersomnia: Secondary | ICD-10-CM

## 2023-01-12 DIAGNOSIS — G4733 Obstructive sleep apnea (adult) (pediatric): Secondary | ICD-10-CM | POA: Diagnosis not present

## 2023-01-12 DIAGNOSIS — R030 Elevated blood-pressure reading, without diagnosis of hypertension: Secondary | ICD-10-CM

## 2023-01-12 DIAGNOSIS — R635 Abnormal weight gain: Secondary | ICD-10-CM | POA: Diagnosis not present

## 2023-01-12 NOTE — Patient Instructions (Signed)
It was nice to see you again today. I am glad to hear, that you have been compliant with your autoPAP machine. You have noted some improvements already. You may still reap more benefit over the next few months, hang in there! You have also fulfilled the insurance-mandated compliance percentage, which is reassuring, so you can get ongoing supplies through your insurance. Please talk to your DME provider about getting replacement supplies on a regular basis. Please be sure to change your filter every month, your mask about every 3 months, hose about every 6 months, humidifier chamber about yearly. Some restrictions are imposed by your insurance carrier with regard to how frequently you can get certain supplies.  Your DME company can provide further details if necessary.   Please continue using your autoPAP regularly. While your insurance requires that you use PAP at least 4 hours each night on 70% of the nights, I recommend, that you not skip any nights and use it throughout the night if you can. Getting used to PAP and staying with the treatment long term does take time and patience and discipline. Untreated obstructive sleep apnea when it is moderate to severe can have an adverse impact on cardiovascular health and raise her risk for heart disease, arrhythmias, hypertension, congestive heart failure, stroke and diabetes. Untreated obstructive sleep apnea causes sleep disruption, nonrestorative sleep, and sleep deprivation. This can have an impact on your day to day functioning and cause daytime sleepiness and impairment of cognitive function, memory loss, mood disturbance, and problems focussing. Using PAP regularly can improve these symptoms.  We can see you in 4-6 months in a video visit with the nurse practitioner.    You can try Melatonin at night for sleep: take 5 mg, one to 2 hours before your bedtime. You can go up to 10 mg if needed. It is over the counter and comes in pill form, chewable form and  spray, if you prefer.

## 2023-01-12 NOTE — Progress Notes (Signed)
Subjective:    Patient ID: Saqib Sunde is a 52 y.o. male.  HPI    Interim history:   Mr. Parra is a 52 year old male with an underlying medical history of asthma, allergies, and obesity, who presents for follow-up consultation of his obstructive sleep apnea after interim testing and starting home AutoPap therapy.  The patient is unaccompanied today.  I saw him on 06/02/2022 at the request of his primary care physician, at which time we talked about his prior diagnosis of sleep apnea.  He had a home sleep test in June 2019 which showed moderate sleep apnea.  He had not pursued treatment in the past.  He was advised to proceed with reevaluation with a home sleep test.  He had a home sleep test on 10/21/2022 which showed severe obstructive sleep apnea with an AHI of 61.1/h, O2 nadir 78% with significant time below or at 88% saturation of over 50 minutes for the night, indicating nocturnal hypoxemia.  Snoring ranged from moderate to loud.  He was advised to proceed with home AutoPap therapy.  His set up date was 11/19/2022, he has a ResMed air sense 11 AutoSet machine.  His DME company is [employer].   Today, 01/12/2023: I reviewed his AutoPap compliance data from 11/19/2022 through 12/18/2022, which is a total of 30 days, during which time he used his machine 29 days with percent use days greater than 4 hours at 93%, indicating excellent compliance with an average usage of 4 hours and 34 minutes, residual AHI at 1.5/h, at goal, average pressure for the 95th percentile at 13.3 cm with a range of 7 to 14 cm with EPR of 3.  Leak on the high side with some fluctuation noted, 95th percentile at 50.3 L/min.  He reports still struggling with tolerance.  He is a restless sleeper and mask dislodges at times.  He does report decrease in snoring per wife's feedback, also his hemoglobin and hematocrit apparently are little better than before, he has a history of elevated hemoglobin and hematocrit values in the past.  He is  very motivated to continue with treatment but has not really noticed a big improvement in his energy level and daytime somnolence, also has trouble maintaining sleep.  He has tried melatonin in the past and slept maybe 30 minutes more with melatonin compared to without but had to take up to 20 mg.  He is willing to retry it.  He has had some interim weight gain.  His blood pressure and pulse are little bit elevated, has not had any caffeine today, limits his caffeine to 1 energy drink and 1 cup of tea no typically, has had some work-related stress.  The patient's allergies, current medications, family history, past medical history, past social history, past surgical history and problem list were reviewed and updated as appropriate.   Previously:   06/02/22: (He) was previously diagnosed with moderate obstructive sleep apnea.  I had evaluated him in 2019.  Home sleep test on 02/01/2018 showed moderate obstructive sleep apnea with an AHI of 24/h, O2 nadir 80% with time below 89% saturation of 30 minutes for the night.  He did not pursue CPAP therapy at the time and was lost to follow-up since then.  He reports ongoing issues with daytime somnolence and since his job changed he has also gained weight.  He does see a wellness doctor who has prescribed metformin and his testosterone injections which he takes twice a week.  Patient often falls asleep on the  couch in the evening.  Bedtime is generally between 9 and 10 PM, rise time around 6:30 AM.  He lives with his wife and 3 of his 4 kids, his 23 year old his on his own, he has a 50 year old at home, a 60 year old daughter and a 85 year old son.  He has caffeine in the form of coffee, usually 1 cup in the morning and occasional energy drink.  He is a non-smoker and drinks alcohol very rarely, maybe special occasions only.  He manages cemeteries, a total of 6 in different locations and has to drive quite a bit for his job.  They have several pets in the household.  He  has nocturia about once per average night and denies recurrent morning or nocturnal headaches.  He is not aware of any family history of sleep apnea. His Epworth sleepiness score is 14 out of 24, fatigue severity score is 27 out of 63.  His wife has regularly noticed breathing pauses while he is asleep.   01/18/2018: Mr. Nakayama is a 52 year old right-handed gentleman with an underlying medical history of asthma, allergies, nocturnal cough and dyspnea and obesity, who reports snoring and excessive daytime somnolence. I reviewed your office note from 11/27/2017. His Epworth sleepiness score is 14 out of 24 today, fatigue score is 1963. He reports waking up with a sense of gasping for air and sleep disruption with waking up several times in the middle of the night. He lives with his wife and his children, they have 4 children. He drinks alcohol rarely, caffeine daily, he is a nonsmoker.His father snores but could not go through with a sleep study as I understand. Patient reports a bedtime of around 9 PM and does not have trouble going to sleep but trouble staying asleep. He denies telltale symptoms of restless leg syndrome but is restless at times. He works as a Therapist, music. He gets about 3-4 hours consolidated sleep and after that it is disrupted. He does not have recurrent morning headaches and does not have night to night nocturia. His wife has noted positives in his breathing while he is asleep. They have several pets in the household which includes one dog and 4 cats.    His Past Medical History Is Significant For: Past Medical History:  Diagnosis Date   Allergy    Asthma     His Past Surgical History Is Significant For: Past Surgical History:  Procedure Laterality Date   VASECTOMY      His Family History Is Significant For: Family History  Problem Relation Age of Onset   Epilepsy Mother    Cancer Mother    Diabetes Father    Sleep apnea Father    Alcohol  abuse Maternal Grandfather    Diabetes Paternal Grandmother     His Social History Is Significant For: Social History   Socioeconomic History   Marital status: Married    Spouse name: Not on file   Number of children: Not on file   Years of education: Not on file   Highest education level: Not on file  Occupational History   Not on file  Tobacco Use   Smoking status: Never   Smokeless tobacco: Never  Substance and Sexual Activity   Alcohol use: No   Drug use: No   Sexual activity: Yes    Comment: Vasectomy  Other Topics Concern   Not on file  Social History Narrative   Not on file   Social Determinants of Health  Financial Resource Strain: Not on file  Food Insecurity: Not on file  Transportation Needs: Not on file  Physical Activity: Not on file  Stress: Not on file  Social Connections: Not on file    His Allergies Are:  Allergies  Allergen Reactions   Aspirin Shortness Of Breath  :   His Current Medications Are:  Outpatient Encounter Medications as of 01/12/2023  Medication Sig   ADVAIR DISKUS 250-50 MCG/ACT AEPB Inhale 1 puff into the lungs 2 (two) times daily.   albuterol (VENTOLIN HFA) 108 (90 Base) MCG/ACT inhaler INHALE 2 PUFFS INTO THE LUNGS EVERY 6 HOURS AS NEEDED   DIGESTIVE ENZYMES PO Take by mouth.   fluticasone (FLONASE) 50 MCG/ACT nasal spray SPRAY 2 SPRAYS INTO EACH NOSTRIL EVERY DAY   Loratadine (CLARITIN PO) Take by mouth.   metFORMIN (GLUCOPHAGE) 500 MG tablet Take 500 mg by mouth daily.   montelukast (SINGULAIR) 10 MG tablet TAKE 1 TABLET BY MOUTH EVERYDAY AT BEDTIME   Multiple Vitamins-Minerals (MULTIVITAMIN WITH MINERALS) tablet Take 1 tablet by mouth daily.   Omega-3 Fatty Acids (OMEGA 3 PO) Take by mouth.   VITAMIN D PO Take by mouth.   No facility-administered encounter medications on file as of 01/12/2023.  :  Review of Systems:  Out of a complete 14 point review of systems, all are reviewed and negative with the exception of these  symptoms as listed below:  Review of Systems  Neurological:        Pt here for CPAP f/u  Pt states wear CPAP 4+ nightly  Pt states wakes up 4 x nightly Pt states still fatigue Pt states still has to drink lot of coffee and energy drinks throughout the day   ESS;16 FSS:33     Objective:  Neurological Exam  Physical Exam Physical Examination:   Vitals:   01/12/23 1134  BP: (!) 143/98  Pulse: (!) 108    General Examination: The patient is a very pleasant 52 y.o. male in no acute distress. He appears well-developed and well-nourished and well groomed.   HEENT: Normocephalic, atraumatic, pupils are equal, round and reactive to light, extraocular tracking is well-preserved with no obvious nystagmus.  Hearing grossly intact.  Face is symmetric with normal facial animation, speech is clear without dysarthria, hypophonia or voice tremor.  Neck with FROM.  Airway examination reveals stable findings, tongue protrudes centrally and palate elevates symmetrically, moderate airway crowding.     Chest: Clear to auscultation without wheezing, rhonchi or crackles noted.   Heart: S1+S2+0, regular and normal without murmurs, rubs or gallops noted.    Abdomen: Soft, non-tender and non-distended.   Extremities: There is no obvious swelling in the distal lower extremities bilaterally.    Skin: Warm and dry without trophic changes noted.    Musculoskeletal: exam reveals no obvious joint deformities.    Neurologically:  Mental status: The patient is awake, alert and oriented in all 4 spheres. His immediate and remote memory, attention, language skills and fund of knowledge are appropriate. There is no evidence of aphasia, agnosia, apraxia or anomia. Speech is clear with normal prosody and enunciation. Thought process is linear. Mood is normal and affect is normal.  Cranial nerves II - XII are as described above under HEENT exam.  Motor exam: Normal bulk, strength and tone is noted. There is no  obvious action or resting tremor.  Fine motor skills and coordination: grossly intact.  Cerebellar testing: No dysmetria or intention tremor. There is no truncal or gait  ataxia.  Sensory exam: intact to light touch in the upper and lower extremities.  Gait, station and balance: He stands easily. No veering to one side is noted. No leaning to one side is noted. Posture is age-appropriate and stance is narrow based. Gait shows normal stride length and normal pace. No problems turning are noted.    Assessment and Plan:  In summary, Collier Kirchberg is a very pleasant 52 year old male with an underlying medical history of asthma, allergies, and obesity, who presents for follow-up consultation of his obstructive sleep apnea after interim home sleep testing and starting home AutoPap therapy.  He has been on treatment nearly 2 months. His home sleep test from 10/21/2022 showed severe obstructive sleep apnea with an AHI of 61.1/h, O2 nadir 78% with significant time below or at 88% saturation of over 50 minutes for the night, indicating nocturnal hypoxemia.  Snoring ranged from moderate to loud for most the night.  Sometimes milder.  He is compliant with treatment but has not been able to use his machine consistently through the night, tends to take it off after 4 hours.  He has noticed some benefit such as reduction of snoring and improvement in his H/H but not significant improvement in his sleep quality and daytime energy level or daytime sleepiness.  He is very motivated to continue with treatment and is highly commended for his treatment adherence thus far.  He is advised to try melatonin again, starting at 5 mg before bedtime, up to 10 mg as needed.  He is advised to use his machine as much as possible, ideal use of 7 to 8 hours.  He may reap more benefit over time and is encouraged to continue with treatment.  His apnea scores are great, leak fluctuates.  He is advised to follow-up to check in with one of our nurse  practitioners in about 4-6 months, we can offer him a MyChart video visit if he prefers.  We went over his test results and compliance data in detail today.  I answered all his questions today and he was in agreement with our plan. I spent 30 minutes in total face-to-face time and in reviewing records during pre-charting, more than 50% of which was spent in counseling and coordination of care, reviewing test results, reviewing medications and treatment regimen and/or in discussing or reviewing the diagnosis of OSA, the prognosis and treatment options. Pertinent laboratory and imaging test results that were available during this visit with the patient were reviewed by me and considered in my medical decision making (see chart for details).

## 2023-01-19 DIAGNOSIS — G4733 Obstructive sleep apnea (adult) (pediatric): Secondary | ICD-10-CM | POA: Diagnosis not present

## 2023-01-30 DIAGNOSIS — R7303 Prediabetes: Secondary | ICD-10-CM | POA: Diagnosis not present

## 2023-01-30 DIAGNOSIS — Z125 Encounter for screening for malignant neoplasm of prostate: Secondary | ICD-10-CM | POA: Diagnosis not present

## 2023-01-30 DIAGNOSIS — E611 Iron deficiency: Secondary | ICD-10-CM | POA: Diagnosis not present

## 2023-02-06 DIAGNOSIS — Z23 Encounter for immunization: Secondary | ICD-10-CM | POA: Diagnosis not present

## 2023-02-06 DIAGNOSIS — Z1331 Encounter for screening for depression: Secondary | ICD-10-CM | POA: Diagnosis not present

## 2023-02-06 DIAGNOSIS — Z0000 Encounter for general adult medical examination without abnormal findings: Secondary | ICD-10-CM | POA: Diagnosis not present

## 2023-02-06 DIAGNOSIS — G4733 Obstructive sleep apnea (adult) (pediatric): Secondary | ICD-10-CM | POA: Diagnosis not present

## 2023-02-19 DIAGNOSIS — G4733 Obstructive sleep apnea (adult) (pediatric): Secondary | ICD-10-CM | POA: Diagnosis not present

## 2023-03-18 ENCOUNTER — Emergency Department (HOSPITAL_COMMUNITY): Payer: BC Managed Care – PPO

## 2023-03-18 ENCOUNTER — Emergency Department (HOSPITAL_COMMUNITY)
Admission: EM | Admit: 2023-03-18 | Discharge: 2023-03-18 | Disposition: A | Discharge status: Home / Self Care | Payer: BC Managed Care – PPO | Attending: Emergency Medicine | Admitting: Emergency Medicine

## 2023-03-18 ENCOUNTER — Other Ambulatory Visit: Payer: Self-pay

## 2023-03-18 ENCOUNTER — Encounter (HOSPITAL_COMMUNITY): Payer: Self-pay

## 2023-03-18 DIAGNOSIS — R109 Unspecified abdominal pain: Secondary | ICD-10-CM | POA: Diagnosis not present

## 2023-03-18 DIAGNOSIS — D72829 Elevated white blood cell count, unspecified: Secondary | ICD-10-CM | POA: Insufficient documentation

## 2023-03-18 DIAGNOSIS — N201 Calculus of ureter: Secondary | ICD-10-CM | POA: Diagnosis not present

## 2023-03-18 DIAGNOSIS — K760 Fatty (change of) liver, not elsewhere classified: Secondary | ICD-10-CM | POA: Diagnosis not present

## 2023-03-18 DIAGNOSIS — N132 Hydronephrosis with renal and ureteral calculous obstruction: Secondary | ICD-10-CM | POA: Diagnosis not present

## 2023-03-18 DIAGNOSIS — N281 Cyst of kidney, acquired: Secondary | ICD-10-CM | POA: Diagnosis not present

## 2023-03-18 LAB — CBC WITH DIFFERENTIAL/PLATELET
Abs Immature Granulocytes: 0.04 10*3/uL (ref 0.00–0.07)
Basophils Absolute: 0 10*3/uL (ref 0.0–0.1)
Basophils Relative: 0 %
Eosinophils Absolute: 0 10*3/uL (ref 0.0–0.5)
Eosinophils Relative: 0 %
HCT: 58 % — ABNORMAL HIGH (ref 39.0–52.0)
Hemoglobin: 19.2 g/dL — ABNORMAL HIGH (ref 13.0–17.0)
Immature Granulocytes: 0 %
Lymphocytes Relative: 6 %
Lymphs Abs: 0.7 10*3/uL (ref 0.7–4.0)
MCH: 30.7 pg (ref 26.0–34.0)
MCHC: 33.1 g/dL (ref 30.0–36.0)
MCV: 92.8 fL (ref 80.0–100.0)
Monocytes Absolute: 0.6 10*3/uL (ref 0.1–1.0)
Monocytes Relative: 5 %
Neutro Abs: 10.6 10*3/uL — ABNORMAL HIGH (ref 1.7–7.7)
Neutrophils Relative %: 89 %
Platelets: 174 10*3/uL (ref 150–400)
RBC: 6.25 MIL/uL — ABNORMAL HIGH (ref 4.22–5.81)
RDW: 12.2 % (ref 11.5–15.5)
WBC: 11.9 10*3/uL — ABNORMAL HIGH (ref 4.0–10.5)
nRBC: 0 % (ref 0.0–0.2)

## 2023-03-18 LAB — BASIC METABOLIC PANEL
Anion gap: 7 (ref 5–15)
BUN: 14 mg/dL (ref 6–20)
CO2: 25 mmol/L (ref 22–32)
Calcium: 8.9 mg/dL (ref 8.9–10.3)
Chloride: 102 mmol/L (ref 98–111)
Creatinine, Ser: 1.7 mg/dL — ABNORMAL HIGH (ref 0.61–1.24)
GFR, Estimated: 49 mL/min — ABNORMAL LOW (ref >60)
Glucose, Bld: 148 mg/dL — ABNORMAL HIGH (ref 70–99)
Potassium: 4.5 mmol/L (ref 3.5–5.1)
Sodium: 134 mmol/L — ABNORMAL LOW (ref 135–145)

## 2023-03-18 LAB — URINALYSIS, ROUTINE W REFLEX MICROSCOPIC
Bilirubin Urine: NEGATIVE
Glucose, UA: NEGATIVE mg/dL
Ketones, ur: NEGATIVE mg/dL
Leukocytes,Ua: NEGATIVE
Nitrite: NEGATIVE
Protein, ur: NEGATIVE mg/dL
RBC / HPF: >50 RBC/hpf (ref 0–5)
Specific Gravity, Urine: 1.016 (ref 1.005–1.030)
pH: 5 (ref 5.0–8.0)

## 2023-03-18 LAB — COMPREHENSIVE METABOLIC PANEL
ALT: 49 U/L — ABNORMAL HIGH (ref 0–44)
AST: 35 U/L (ref 15–41)
Albumin: 4.2 g/dL (ref 3.5–5.0)
Alkaline Phosphatase: 70 U/L (ref 38–126)
Anion gap: 12 (ref 5–15)
BUN: 16 mg/dL (ref 6–20)
CO2: 27 mmol/L (ref 22–32)
Calcium: 9.5 mg/dL (ref 8.9–10.3)
Chloride: 97 mmol/L — ABNORMAL LOW (ref 98–111)
Creatinine, Ser: 1.75 mg/dL — ABNORMAL HIGH (ref 0.61–1.24)
GFR, Estimated: 47 mL/min — ABNORMAL LOW (ref >60)
Glucose, Bld: 154 mg/dL — ABNORMAL HIGH (ref 70–99)
Potassium: 4.1 mmol/L (ref 3.5–5.1)
Sodium: 136 mmol/L (ref 135–145)
Total Bilirubin: 1 mg/dL (ref 0.3–1.2)
Total Protein: 7.5 g/dL (ref 6.5–8.1)

## 2023-03-18 MED ORDER — DOXAZOSIN MESYLATE 4 MG PO TABS: 4.0000 mg | ORAL_TABLET | Freq: Every day | ORAL | 0 refills | Status: AC

## 2023-03-18 MED ORDER — TAMSULOSIN HCL 0.4 MG PO CAPS: 0.4000 mg | ORAL_CAPSULE | Freq: Every day | ORAL | 0 refills | Status: DC

## 2023-03-18 MED ORDER — HYDROMORPHONE HCL 1 MG/ML IJ SOLN
1.0000 mg | Freq: Once | INTRAMUSCULAR | Status: AC
  Administered 2023-03-18: 1 mg via INTRAVENOUS
  Filled 2023-03-18: qty 1

## 2023-03-18 MED ORDER — HYDROMORPHONE HCL 1 MG/ML IJ SOLN
0.5000 mg | Freq: Once | INTRAMUSCULAR | Status: AC
  Administered 2023-03-18: 0.5 mg via INTRAVENOUS
  Filled 2023-03-18: qty 1

## 2023-03-18 MED ORDER — OXYCODONE-ACETAMINOPHEN 5-325 MG PO TABS: 1.0000 | ORAL_TABLET | Freq: Four times a day (QID) | ORAL | 0 refills | Status: AC | PRN

## 2023-03-18 MED ORDER — LACTATED RINGERS IV BOLUS
1000.0000 mL | Freq: Once | INTRAVENOUS | Status: AC
  Administered 2023-03-18: 1000 mL via INTRAVENOUS

## 2023-03-18 MED ORDER — ONDANSETRON HCL 4 MG PO TABS: 4.0000 mg | ORAL_TABLET | Freq: Three times a day (TID) | ORAL | 0 refills | Status: AC | PRN

## 2023-03-18 MED ORDER — ONDANSETRON HCL 4 MG/2ML IJ SOLN
4.0000 mg | Freq: Once | INTRAMUSCULAR | Status: AC
  Administered 2023-03-18: 4 mg via INTRAVENOUS
  Filled 2023-03-18: qty 2

## 2023-03-18 NOTE — ED Provider Notes (Signed)
[facility]EMERGENCY DEPT [facility] Emergency Department Provider Note MRN:  [id number]  Arrival date & time: 03/18/23     Chief Complaint   Flank Pain   History of Present Illness   Anthony [name] is a 52 y.o. year-old male presents to the ED with chief complaint of left flank pain that awakened patient from sleep around midnight.  Pain radiates to the left abdomen.  Denies dysuria or hematuria, but does report urinary frequency.  Denies fevers or chills.  Has had some nausea, but no vomiting.  Rates his pain as a 7 out of 10.  States that it spikes up to a 10 out of 10 occasionally.  Has not had any improvement throughout the night.  Has family history of kidney stones, but has not personally had a kidney stone.  History provided by patient.   Review of Systems  Pertinent positive and negative review of systems noted in HPI.    Physical Exam   Vitals:   03/18/23 0955 03/18/23 1407  BP: (!) 148/96 (!) 146/91  Pulse: (!) 111 (!) 118  Resp: 16 16  Temp:  (!) 97 F (36.1 C)  SpO2: 93% 100%    CONSTITUTIONAL:  uncomfortable-appearing, NAD NEURO:  Alert and oriented x 3, CN 3-12 grossly intact EYES:  eyes equal and reactive ENT/NECK:  Supple, no stridor  CARDIO:  tachycardic, regular rhythm, appears well-perfused  PULM:  No respiratory distress,  GI/GU:  non-distended, mild LLQ tenderness MSK/SPINE:  No gross deformities, no edema, moves all extremities  SKIN:  no rash, atraumatic   *Additional and/or pertinent findings included in MDM below  Diagnostic and Interventional Summary    EKG Interpretation Date/Time:    Ventricular Rate:    PR Interval:    QRS Duration:    QT Interval:    QTC Calculation:   R Axis:      Text Interpretation:         Labs Reviewed  URINALYSIS, ROUTINE W REFLEX MICROSCOPIC - Abnormal; Notable for the following components:      Result Value   Hgb urine dipstick MODERATE (*)    Bacteria, UA RARE (*)    All other components within  normal limits  CBC WITH DIFFERENTIAL/PLATELET - Abnormal; Notable for the following components:   WBC 11.9 (*)    RBC 6.25 (*)    Hemoglobin 19.2 (*)    HCT 58.0 (*)    Neutro Abs 10.6 (*)    All other components within normal limits  COMPREHENSIVE METABOLIC PANEL - Abnormal; Notable for the following components:   Chloride 97 (*)    Glucose, Bld 154 (*)    Creatinine, Ser 1.75 (*)    ALT 49 (*)    GFR, Estimated 47 (*)    All other components within normal limits  BASIC METABOLIC PANEL - Abnormal; Notable for the following components:   Sodium 134 (*)    Glucose, Bld 148 (*)    Creatinine, Ser 1.70 (*)    GFR, Estimated 49 (*)    All other components within normal limits    CT Renal Stone Study  Final Result      Medications  HYDROmorphone (DILAUDID) injection 1 mg (1 mg Intravenous Given 03/18/23 0554)  ondansetron (ZOFRAN) injection 4 mg (4 mg Intravenous Given 03/18/23 0554)  lactated ringers bolus 1,000 mL (0 mLs Intravenous Stopped 03/18/23 0832)  HYDROmorphone (DILAUDID) injection 1 mg (1 mg Intravenous Given 03/18/23 0721)  ondansetron (ZOFRAN) injection 4 mg (4 mg Intravenous Given  03/18/23 1028)  HYDROmorphone (DILAUDID) injection 0.5 mg (0.5 mg Intravenous Given 03/18/23 1029)  HYDROmorphone (DILAUDID) injection 0.5 mg (0.5 mg Intravenous Given 03/18/23 1355)     Procedures  /  Critical Care Procedures  ED Course and Medical Decision Making  I have reviewed the triage vital signs, the nursing notes, and pertinent available records from the EMR.  Social Determinants Affecting Complexity of Care: Patient has no clinically significant social determinants affecting this chief complaint..   ED Course: Clinical Course as of 03/18/23 2158  Wed Mar 18, 2023  1610 Pt re-evaluated and discussed with patient treatment plan. Answered all available questions.  [SB]  [id number] Consult with Urology, Elmon Kirschner, NP who recommends BMP to check for kidney function. If  creatinine still elevated, then consult for further evaluation.   [SB]  1028 Discussed with urology PA follow when BMP noted creatinine improved slightly to 1.7, GFR slightly improved to 49.  Urology PA notes that they will provide the urology attending with this information to determine disposition for patient.  [SB]  1341 Pt re-evaluated and noted that urology requested that he follow up in the office on 03/20/23. Discussed discharge treatment plan  [SB]  1351 Discussed with urology PA-C, Elmon Kirschner discharge treatment plan.  Urology PA recommends patient being discharged home with expulsive therapy as well as a strainer.  Also notes that patient will receive a phone call to establish a follow-up appointment on 03/20/2023. [SB]    Clinical Course User Index [SB] Blue, Soijett A, PA-C    Medical Decision Making Patient here with sudden onset left flank pain that started around midnight.  Pain wraps around the abdomen.  Has had some urinary frequency.  Concern for kidney stone.  Differential also would include UTI, pyelonephritis, diverticulitis.  Will check CT.  Labs pending.  Will treat pain with IV Dilaudid.  Patient has aspirin allergy, will hold Toradol.  Amount and/or Complexity of Data Reviewed Labs: ordered.    Details: Mild leukocytosis UA inconsistent with infection Cr increased at 1.75 Radiology: ordered and independent interpretation performed.    Details: Distal ureteral stone seen on CT.  Risk Prescription drug management.         Consultants: No consultations were needed in caring for this patient.   Treatment and Plan: I considered admission due to patient's initial presentation, but after considering the examination and diagnostic results, patient will not require admission and can be discharged with outpatient follow-up.    Final Clinical Impressions(s) / ED Diagnoses     ICD-10-CM   1. Ureterolithiasis  N20.1       ED Discharge Orders           Ordered    oxyCODONE-acetaminophen (PERCOCET/ROXICET) 5-325 MG tablet  Every 6 hours PRN        03/18/23 1404    ondansetron (ZOFRAN) 4 MG tablet  Every 8 hours PRN        03/18/23 1404    tamsulosin (FLOMAX) 0.4 MG CAPS capsule  Daily,   Status:  Discontinued        03/18/23 1404    doxazosin (CARDURA) 4 MG tablet  Daily        03/18/23 1433              Discharge Instructions Discussed with and Provided to Patient:     Discharge Instructions      It was a pleasure taking care of you today!  Your urine didn't show any concerning emergent findings  at this time. Your labs showed slightly elevated WBC, likely due to the stone. Your kidney function was slightly elevated, this can be managed with ensuring to maintain fluid intake.  Your scan today showed concerns for a 6 mm kidney stone on the left side.   You may crush the Percocet (oxycodone-acetaminophen) and the Doxazosin  The urologist office ([facility]) will call you to set up a follow up appointment for Thursday, 03/20/2023 regarding today's ED visit.  You will be sent a short course of Percocet to take as directed for breakthrough pain.  You will also be sent a prescription for Flomax, take as directed.  We sent a prescription for Zofran, take as needed for nausea/vomiting.  If you are experiencing nausea/vomiting wait at least 30 minutes before you attempt small sips/small bites of food/fluid.  Return to the emergency department if you are experiencing increasing/worsening symptoms.       Roxy Horseman, PA-C 03/18/23 2158    Gilda Crease, MD 03/19/23 [phone]

## 2023-03-18 NOTE — ED Triage Notes (Signed)
Sudden left sided flank pain that radiates around to abdomen beginning ~12am while asleep.

## 2023-03-18 NOTE — Discharge Instructions (Addendum)
It was a pleasure taking care of you today!  Your urine didn't show any concerning emergent findings at this time. Your labs showed slightly elevated WBC, likely due to the stone. Your kidney function was slightly elevated, this can be managed with ensuring to maintain fluid intake.  Your scan today showed concerns for a 6 mm kidney stone on the left side.   You may crush the Percocet (oxycodone-acetaminophen) and the Doxazosin  The urologist office ([facility]) will call you to set up a follow up appointment for Thursday, 03/20/2023 regarding today's ED visit.  You will be sent a short course of Percocet to take as directed for breakthrough pain.  You will also be sent a prescription for Flomax, take as directed.  We sent a prescription for Zofran, take as needed for nausea/vomiting.  If you are experiencing nausea/vomiting wait at least 30 minutes before you attempt small sips/small bites of food/fluid.  Return to the emergency department if you are experiencing increasing/worsening symptoms.

## 2023-03-18 NOTE — ED Provider Notes (Signed)
Care transferred from [place], PA-C at time of sign out. See their note for full assessment.   Briefly: Patient is 52 y.o. male who presents to the ED with left sided flank pain onset PTA.      Plan: Plan per previous PA-C: CT renal stone study to rule out kidney stone.  Improvement in pain and heart rate, likely discharge home.   Labs Reviewed  URINALYSIS, ROUTINE W REFLEX MICROSCOPIC - Abnormal; Notable for the following components:      Result Value   Hgb urine dipstick MODERATE (*)    Bacteria, UA RARE (*)    All other components within normal limits  CBC WITH DIFFERENTIAL/PLATELET - Abnormal; Notable for the following components:   WBC 11.9 (*)    RBC 6.25 (*)    Hemoglobin 19.2 (*)    HCT 58.0 (*)    Neutro Abs 10.6 (*)    All other components within normal limits  COMPREHENSIVE METABOLIC PANEL - Abnormal; Notable for the following components:   Chloride 97 (*)    Glucose, Bld 154 (*)    Creatinine, Ser 1.75 (*)    ALT 49 (*)    GFR, Estimated 47 (*)    All other components within normal limits  BASIC METABOLIC PANEL - Abnormal; Notable for the following components:   Sodium 134 (*)    Glucose, Bld 148 (*)    Creatinine, Ser 1.70 (*)    GFR, Estimated 49 (*)    All other components within normal limits    CT Renal Stone Study  Result Date: 03/18/2023 CLINICAL DATA:  Abdominal/flank pain with stone suspected EXAM: CT ABDOMEN AND PELVIS WITHOUT CONTRAST TECHNIQUE: Multidetector CT imaging of the abdomen and pelvis was performed following the standard protocol without IV contrast. RADIATION DOSE REDUCTION: This exam was performed according to the departmental dose-optimization program which includes automated exposure control, adjustment of the mA and/or kV according to patient size and/or use of iterative reconstruction technique. COMPARISON:  None Available. FINDINGS: Lower chest:  No contributory findings. Hepatobiliary: Hepatic steatosis.No evidence of biliary  obstruction or stone. Pancreas: Unremarkable. Spleen: Unremarkable. Adrenals/Urinary Tract: Negative adrenals. Left hydronephrosis and hydroureter related to a UVJ calculus measuring 6 mm in length. 3 or 4 left renal calculi measuring up to 4 mm on coronal reformats. No right nephrolithiasis. There is a right renal cyst with simple appearance measuring up to 4.2 cm. No follow-up imaging is recommended. Unremarkable bladder. Stomach/Bowel:  No obstruction. No appendicitis. Vascular/Lymphatic: No acute vascular abnormality. No mass or adenopathy. Reproductive:No pathologic findings.  Vasectomy type clips. Other: No ascites or pneumoperitoneum. Musculoskeletal: No acute abnormalities. IMPRESSION: 1. Obstructing 6 mm stone at the left UVJ. 2. Left renal calculi. 3. Hepatic steatosis. Electronically Signed   By: Tiburcio Pea M.D.   On: 03/18/2023 06:49    Clinical Course as of 03/18/23 1830  Wed Mar 18, 2023  1610 Pt re-evaluated and discussed with patient treatment plan. Answered all available questions.  [SB]  [id number] Consult with Urology, Elmon Kirschner, NP who recommends BMP to check for kidney function. If creatinine still elevated, then consult for further evaluation.   [SB]  1028 Discussed with urology PA follow when BMP noted creatinine improved slightly to 1.7, GFR slightly improved to 49.  Urology PA notes that they will provide the urology attending with this information to determine disposition for patient.  [SB]  1341 Pt re-evaluated and noted that urology requested that he follow up in the office on 03/20/23. Discussed  discharge treatment plan  [SB]  1351 Discussed with urology PA-C, Elmon Kirschner discharge treatment plan.  Urology PA recommends patient being discharged home with expulsive therapy as well as a strainer.  Also notes that patient will receive a phone call to establish a follow-up appointment on 03/20/2023. [SB]    Clinical Course User Index [SB] Maudry Zeidan A, PA-C      CT scan with findings of ureterolithiasis.  Consult with urology who evaluated patient in the emergency department and recommends patient discharged home with expulsive therapy and follow-up in the office on 03/20/2023.  Discussed with patient plans per urology.  Patient provided with strainer cup today.  Work note provided.  Patient sent with prescription for Zofran and tamsulosin.  Tamsulosin was changed to doxazosin as per pharmacy recommendations due to patient not being able to tolerate pills and inability to crush tamsulosin.  PDMP reviewed, patient sent with a short course of Percocet. Supportive care and strict return precautions discussed with patient. Patient acknowledges and verbalizes understanding. Pt appears safe for discharge. Follow up as indicated in discharge paperwork.    This chart was dictated using voice recognition software, Dragon. Despite the best efforts of this provider to proofread and correct errors, errors may still occur which can change documentation meaning.  ED Discharge Orders          Ordered    oxyCODONE-acetaminophen (PERCOCET/ROXICET) 5-325 MG tablet  Every 6 hours PRN        03/18/23 1404    ondansetron (ZOFRAN) 4 MG tablet  Every 8 hours PRN        03/18/23 1404    tamsulosin (FLOMAX) 0.4 MG CAPS capsule  Daily,   Status:  Discontinued        03/18/23 1404    doxazosin (CARDURA) 4 MG tablet  Daily        03/18/23 1433             Gregary Blackard A, PA-C 03/18/23 1831    Tegeler, Canary Brim, MD 03/19/23 1234

## 2023-03-26 DIAGNOSIS — N202 Calculus of kidney with calculus of ureter: Secondary | ICD-10-CM | POA: Diagnosis not present

## 2023-03-30 ENCOUNTER — Other Ambulatory Visit: Payer: Self-pay | Admitting: Urology

## 2023-04-05 NOTE — Patient Instructions (Signed)
SURGICAL WAITING ROOM VISITATION Patients having surgery or a procedure may have no more than 2 support people in the waiting area - these visitors may rotate in the visitor waiting room.   Due to an increase in RSV and influenza rates and associated hospitalizations, children ages 34 and under may not visit patients in [facility] hospitals. If the patient needs to stay at the hospital during part of their recovery, the visitor guidelines for inpatient rooms apply.  PRE-OP VISITATION  Pre-op nurse will coordinate an appropriate time for 1 support person to accompany the patient in pre-op.  This support person may not rotate.  This visitor will be contacted when the time is appropriate for the visitor to come back in the pre-op area.  Please refer to the [facility] website for the visitor guidelines for Inpatients (after your surgery is over and you are in a regular room).  You are not required to quarantine at this time prior to your surgery. However, you must do this: Hand Hygiene often Do NOT share personal items Notify your provider if you are in close contact with someone who has COVID or you develop fever 100.4 or greater, new onset of sneezing, cough, sore throat, shortness of breath or body aches.  If you test positive for Covid or have been in contact with anyone that has tested positive in the last 10 days please notify you surgeon.    Your procedure is scheduled on: Thursday  April 16, 2023   Report to [facility] Main Entrance: [address] entrance where the [employer] is available.   Report to admitting at: 1:00  PM  Call this number if you have any questions or problems the morning of surgery [phone]  DO NOT EAT OR DRINK ANYTHING AFTER MIDNIGHT THE NIGHT PRIOR TO YOUR SURGERY / PROCEDURE.   FOLLOW BOWEL PREP AND ANY ADDITIONAL PRE OP INSTRUCTIONS YOU RECEIVED FROM YOUR SURGEON'S OFFICE!!!   Oral Hygiene is also important to reduce your risk of infection.         Remember - BRUSH YOUR TEETH THE MORNING OF SURGERY WITH YOUR REGULAR TOOTHPASTE  Do NOT smoke after Midnight the night before surgery.  STOP TAKING all Vitamins, Herbs and supplements 1 week before your surgery.   Take ONLY these medicines the morning of surgery with A SIP OF WATER: Doxazosin (Cardura).   You may use your Flonase nasal spray, Advair/ Albuterol inhalers if needed.  You may take Percocet if needed for pain   If You have been diagnosed with Sleep Apnea - Bring CPAP mask and tubing day of surgery. We will provide you with a CPAP machine on the day of your surgery.                   You may not have any metal on your body including  jewelry, and body piercing  Do not wear lotions, powders, cologne, or deodorant  Men may shave face and neck.  Contacts, Hearing Aids, dentures or bridgework may not be worn into surgery. DENTURES WILL BE REMOVED PRIOR TO SURGERY PLEASE DO NOT APPLY "Poly grip" OR ADHESIVES!!!   Patients discharged on the day of surgery will not be allowed to drive home.  Someone NEEDS to stay with you for the first 24 hours after anesthesia.  Do not bring your home medications to the hospital. The Pharmacy will dispense medications listed on your medication list to you during your admission in the Hospital.  Special Instructions:  Bring a copy of your healthcare power of attorney and living will documents the day of surgery, if you wish to have them scanned into your [facility] Records- EPIC  Please read over the following fact sheets you were given: IF YOU HAVE QUESTIONS ABOUT YOUR PRE-OP INSTRUCTIONS, PLEASE CALL [phone].   [facility] - Preparing for Surgery Before surgery, you can play an important role.  Because skin is not sterile, your skin needs to be as free of germs as possible.  You can reduce the number of germs on your skin by washing with CHG (chlorahexidine gluconate) soap before surgery.  CHG is an antiseptic cleaner which kills  germs and bonds with the skin to continue killing germs even after washing. Please DO NOT use if you have an allergy to CHG or antibacterial soaps.  If your skin becomes reddened/irritated stop using the CHG and inform your nurse when you arrive at Short Stay. Do not shave (including legs and underarms) for at least 48 hours prior to the first CHG shower.  You may shave your face/neck.  Please follow these instructions carefully:  1.  Shower with CHG Soap the night before surgery and the  morning of surgery.  2.  If you choose to wash your hair, wash your hair first as usual with your normal  shampoo.  3.  After you shampoo, rinse your hair and body thoroughly to remove the shampoo.                             4.  Use CHG as you would any other liquid soap.  You can apply chg directly to the skin and wash.  Gently with a scrungie or clean washcloth.  5.  Apply the CHG Soap to your body ONLY FROM THE NECK DOWN.   Do not use on face/ open                           Wound or open sores. Avoid contact with eyes, ears mouth and genitals (private parts).                       Wash face,  Genitals (private parts) with your normal soap.             6.  Wash thoroughly, paying special attention to the area where your  surgery  will be performed.  7.  Thoroughly rinse your body with warm water from the neck down.  8.  DO NOT shower/wash with your normal soap after using and rinsing off the CHG Soap.            9.  Pat yourself dry with a clean towel.            10.  Wear clean pajamas.            11.  Place clean sheets on your bed the night of your first shower and do not  sleep with pets.  ON THE DAY OF SURGERY : Do not apply any lotions/deodorants the morning of surgery.  Please wear clean clothes to the hospital/surgery center.    FAILURE TO FOLLOW THESE INSTRUCTIONS MAY RESULT IN THE CANCELLATION OF YOUR SURGERY  PATIENT SIGNATURE_________________________________  NURSE  SIGNATURE__________________________________  ________________________________________________________________________

## 2023-04-05 NOTE — Progress Notes (Signed)
COVID Vaccine received:  []  No [x]  Yes Date of any COVID positive Test in last 90 days:  none  PCP - Eloise Levels, DO  at [facility]  [phone] Cardiologist - none Neurology- [name], MD  Chest x-ray - 11-27-2017  2v   Epic EKG -  07-18-2012   Epic  will repeat at PST Stress Test -  ECHO -  Cardiac Cath -   PCR screen: []  Ordered & Completed           []   No Order but Needs PROFEND           [x]   N/A for this surgery  Surgery Plan:  [x]  Ambulatory                            []  Outpatient in bed                            []  Admit  Anesthesia:    [x]  General  []  Spinal                           []   Choice []   MAC  Bowel Prep - [x]  No  []   Yes ______  Pacemaker / ICD device [x]  No []  Yes   Spinal Cord Stimulator:[]  No []  Yes       History of Sleep Apnea? []  No [x]  Yes   CPAP used?- []  No [x]  Yes    Does the patient monitor blood sugar?          []  No []  Yes  []  N/A  Patient has: []  NO Hx DM   [x]  Pre-DM                 []  DM1  []   DM2 Diabetic medications/ instructions: Metformin- on hold at this time  Blood Thinner / Instructions:none Aspirin Instructions:  none  ERAS Protocol Ordered: [x]  No  []  Yes Patient is to be NPO after: Midnight prior  Comments:  Activity level: Patient is able to climb a flight of stairs without difficulty; []  No CP   but would have some SOB.  Patient can  perform ADLs without assistance.   Anesthesia review: OSA-CPAP, asthma, Secondary Polycythemia ( HGB 19.2 at PST appt) , Pre-DM  Patient denies shortness of breath, fever, cough and chest pain at PAT appointment.  Patient verbalized understanding and agreement to the Pre-Surgical Instructions that were given to them at this PAT appointment. Patient was also educated of the need to review these PAT instructions again prior to his surgery.I reviewed the appropriate phone numbers to call if they have any and questions or concerns.

## 2023-04-07 ENCOUNTER — Other Ambulatory Visit: Payer: Self-pay

## 2023-04-07 ENCOUNTER — Encounter (HOSPITAL_COMMUNITY)
Admission: RE | Admit: 2023-04-07 | Discharge: 2023-04-07 | Disposition: A | Discharge status: Home / Self Care | Payer: BC Managed Care – PPO | Source: Ambulatory Visit | Attending: Urology | Admitting: Urology

## 2023-04-07 ENCOUNTER — Encounter (HOSPITAL_COMMUNITY): Payer: Self-pay

## 2023-04-07 VITALS — BP 124/80 | HR 99 | Temp 99.0°F | Resp 18 | Ht 66.0 in | Wt 238.0 lb

## 2023-04-07 DIAGNOSIS — Z01812 Encounter for preprocedural laboratory examination: Secondary | ICD-10-CM | POA: Insufficient documentation

## 2023-04-07 DIAGNOSIS — R7303 Prediabetes: Secondary | ICD-10-CM

## 2023-04-07 DIAGNOSIS — Z01818 Encounter for other preprocedural examination: Secondary | ICD-10-CM | POA: Diagnosis not present

## 2023-04-07 DIAGNOSIS — R9431 Abnormal electrocardiogram [ECG] [EKG]: Secondary | ICD-10-CM | POA: Insufficient documentation

## 2023-04-07 DIAGNOSIS — Z01810 Encounter for preprocedural cardiovascular examination: Secondary | ICD-10-CM | POA: Insufficient documentation

## 2023-04-07 HISTORY — DX: Obstructive sleep apnea (adult) (pediatric): G47.33

## 2023-04-07 HISTORY — DX: Pneumonia, unspecified organism: J18.9

## 2023-04-07 HISTORY — DX: Chronic kidney disease, unspecified: N18.9

## 2023-04-07 HISTORY — DX: Personal history of urinary calculi: Z87.442

## 2023-04-07 HISTORY — DX: Prediabetes: R73.03

## 2023-04-07 HISTORY — DX: Myoneural disorder, unspecified: G70.9

## 2023-04-07 HISTORY — DX: Disease of blood and blood-forming organs, unspecified: D75.9

## 2023-04-07 LAB — CBC
HCT: 58.4 % — ABNORMAL HIGH (ref 39.0–52.0)
Hemoglobin: 19.2 g/dL — ABNORMAL HIGH (ref 13.0–17.0)
MCH: 30.1 pg (ref 26.0–34.0)
MCHC: 32.9 g/dL (ref 30.0–36.0)
MCV: 91.7 fL (ref 80.0–100.0)
Platelets: 174 10*3/uL (ref 150–400)
RBC: 6.37 MIL/uL — ABNORMAL HIGH (ref 4.22–5.81)
RDW: 12.3 % (ref 11.5–15.5)
WBC: 5 10*3/uL (ref 4.0–10.5)
nRBC: 0 % (ref 0.0–0.2)

## 2023-04-07 LAB — BASIC METABOLIC PANEL
Anion gap: 9 (ref 5–15)
BUN: 15 mg/dL (ref 6–20)
CO2: 25 mmol/L (ref 22–32)
Calcium: 9 mg/dL (ref 8.9–10.3)
Chloride: 102 mmol/L (ref 98–111)
Creatinine, Ser: 1.2 mg/dL (ref 0.61–1.24)
GFR, Estimated: >60 mL/min (ref >60)
Glucose, Bld: 140 mg/dL — ABNORMAL HIGH (ref 70–99)
Potassium: 4.2 mmol/L (ref 3.5–5.1)
Sodium: 136 mmol/L (ref 135–145)

## 2023-04-10 DIAGNOSIS — G4733 Obstructive sleep apnea (adult) (pediatric): Secondary | ICD-10-CM | POA: Diagnosis not present

## 2023-04-13 ENCOUNTER — Encounter (HOSPITAL_COMMUNITY): Payer: Self-pay

## 2023-04-13 NOTE — Progress Notes (Signed)
Case: [id number] Date/Time: 04/16/23 1500   Procedure: CYSTOSCOPY/LEFT URETEROSCOPY/HOLMIUM LASER/LEFT URETERAL STENT PLACEMENT (Left)   Anesthesia type: General   Pre-op diagnosis: LEFT URETERAL CALCULUS   Location: [facility] ROOM 03 / [facility]   Surgeons: Heloise Purpura, MD       DISCUSSION: Anthony [name] is a 52 year old male who presents to PAT prior to surgery above.  Past medical history significant for asthma, prediabetes, CKD, OSA (uses CPAP), history of kidney stones, secondary polycythemia.  No prior anesthesia complications  Patient saw his PCP on 04/09/2023 for preop clearance.  Per his PCP: "RCRI score of 0 which equates to 3 to 9% 30-day risk of death, MI, or cardiac arrest.  Otherwise he is active and can walk up a flight of stairs without chest pain.  Also able to complete greater than 4 METS.  I give medical clearance and cardiac clearance.  He has mild to moderate risk given history of breathing concerns, OSA, elevated hemoglobin."  VS: BP 124/80 Comment: right arm sitting  Pulse 99   Temp 37.2 C (Oral)   Resp 18   Ht 5\' 6"  (1.676 m)   Wt 108 kg   SpO2 97%   BMI 38.41 kg/m   PROVIDERS: Charlane Ferretti, DO   LABS: Labs reviewed: Acceptable for surgery. (all labs ordered are listed, but only abnormal results are displayed)  Labs Reviewed  BASIC METABOLIC PANEL - Abnormal; Notable for the following components:      Result Value   Glucose, Bld 140 (*)    All other components within normal limits  CBC - Abnormal; Notable for the following components:   RBC 6.37 (*)    Hemoglobin 19.2 (*)    HCT 58.4 (*)    All other components within normal limits     IMAGES:  CT Renal 03/18/23:  IMPRESSION: 1. Obstructing 6 mm stone at the left UVJ. 2. Left renal calculi. 3. Hepatic steatosis.     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   Pneumonia    Pre-diabetes     Past Surgical History:  Procedure Laterality Date   CARPAL TUNNEL RELEASE Right 2017   MOUTH SURGERY     impacted teeth removed and wisdom teeth   VASECTOMY      MEDICATIONS:  ADVAIR DISKUS 250-50 MCG/ACT AEPB   albuterol (VENTOLIN HFA) 108 (90 Base) MCG/ACT inhaler   ANASTROZOLE PO   DIGESTIVE ENZYMES PO   doxazosin (CARDURA) 4 MG tablet   fluticasone (FLONASE) 50 MCG/ACT nasal spray   loratadine (CLARITIN) 10 MG tablet   metFORMIN (GLUCOPHAGE) 500 MG tablet   montelukast (SINGULAIR) 10 MG tablet   Multiple Vitamins-Minerals (MULTIVITAMIN WITH MINERALS) tablet   Omega-3 Fatty Acids (OMEGA 3 PO)   ondansetron (ZOFRAN) 4 MG tablet   oxyCODONE-acetaminophen (PERCOCET/ROXICET) 5-325 MG tablet   TESTOSTERONE CYPIONATE IJ   VITAMIN D PO   No current facility-administered medications for this encounter.   Marcille Blanco [facility] Short Stay/Anesthesiology [facility] Phone [phone] 04/13/2023 2:19 PM

## 2023-04-13 NOTE — Anesthesia Preprocedure Evaluation (Signed)
Anesthesia Evaluation  Patient identified by MRN, date of birth, ID band Patient awake    Reviewed: Allergy & Precautions, NPO status , Patient's Chart, lab work & pertinent test results, reviewed documented beta blocker date and time   Airway Mallampati: III  TM Distance: >3 FB Neck ROM: Full    Dental no notable dental hx. (+) Teeth Intact, Caps, Dental Advisory Given   Pulmonary asthma , sleep apnea and Continuous Positive Airway Pressure Ventilation , pneumonia, resolved   Pulmonary exam normal breath sounds clear to auscultation       Cardiovascular negative cardio ROS Normal cardiovascular exam Rhythm:Regular Rate:Normal  EKG  NSR, RAD, incomplete RBBB pattern, possible anterior MI age indeterminant   Neuro/Psych  Neuromuscular disease  negative psych ROS   GI/Hepatic negative GI ROS, Neg liver ROS,,,  Endo/Other  Obesity Pre diabetes on Metformin  Renal/GU Renal diseaseLeft ureteral calculus  negative genitourinary   Musculoskeletal negative musculoskeletal ROS (+)    Abdominal  (+) + obese  Peds  Hematology  (+) Blood dyscrasia Secondary polycythemia   Anesthesia Other Findings   Reproductive/Obstetrics                             Anesthesia Physical Anesthesia Plan  ASA: 2  Anesthesia Plan: General   Post-op Pain Management: Minimal or no pain anticipated and Dilaudid IV   Induction: Intravenous  PONV Risk Score and Plan: 4 or greater and Treatment may vary due to age or medical condition, Midazolam, Ondansetron and Dexamethasone  Airway Management Planned: LMA  Additional Equipment: None  Intra-op Plan:   Post-operative Plan: Extubation in OR  Informed Consent: I have reviewed the patients History and Physical, chart, labs and discussed the procedure including the risks, benefits and alternatives for the proposed anesthesia with the patient or authorized  representative who has indicated his/her understanding and acceptance.     Dental advisory given  Plan Discussed with: CRNA and Anesthesiologist  Anesthesia Plan Comments: (See PAT note from 8/6 by Sherlie Ban PA-C )        Anesthesia Quick Evaluation

## 2023-04-15 NOTE — H&P (Signed)
Office Visit Report     03/26/2023   --------------------------------------------------------------------------------   Anthony [name]  MRN: [id number]  DOB: April 05, 1971, 52 year old Male  SSN:    PRIMARY CARE:     REFERRING:    PROVIDER:  Heloise Purpura, M.D.  LOCATION:  [facility] [phone]     --------------------------------------------------------------------------------   CC/HPI: Left ureteral and left renal calculi   Anthony [name] is a 52 year old gentleman with no known history of urolithiasis until approximately 1 week ago when he developed the acute onset of moderate to severe left-sided flank pain with radiation to his left upper quadrant. He proceeded to the emergency department and a CT stone study confirmed a 5 to 6 mm left UVJ calculus with smaller nonobstructing left renal calculi. His pain was able to be adequately controlled and he was able to be discharged home. He is only required 1 Percocet tablet the day of his discharge. He otherwise has remained asymptomatic for the last 4 to 5 days. He denies any fever. He did have some initial nausea and vomiting which has resolved. He has been working normally and resuming normal activities. He does have a paternal family history of urolithiasis.     ALLERGIES: Aspirin    MEDICATIONS: Metformin Hcl 500 mg tablet  Tamsulosin Hcl 0.4 mg capsule  Advair Diskus 250 mcg-50 mcg/dose blister, with inhalation device  Albuterol Sulfate Hfa 90 mcg hfa aerosol with adapter  Claritin  Digestive Enzymes  Fluticasone Propionate 50 mcg/actuation spray, suspension  Montelukast Sodium 10 mg tablet  Multivitamin  Omega 3  Ondansetron Hcl 4 mg tablet  Oxycodone-Acetaminophen 5 mg-325 mg tablet  Vitamin D     GU PSH: No GU PSH    NON-GU PSH: No Non-GU PSH    GU PMH: None   NON-GU PMH: Asthma Diabetes Type 2    FAMILY HISTORY: 1 Daughter - Daughter 3 Son's - Son Kidney Stones - Father   SOCIAL HISTORY:  Marital Status: Unknown Preferred Language: English Social Drinker.  Drinks 1 caffeinated drink per day. Patient's occupation Public relations account executive.    REVIEW OF SYSTEMS:    GU Review Male:   Patient denies frequent urination, hard to postpone urination, burning/ pain with urination, get up at night to urinate, leakage of urine, stream starts and stops, trouble starting your streams, and have to strain to urinate .  Gastrointestinal (Lower):   Patient denies diarrhea and constipation.  Gastrointestinal (Upper):   Patient denies vomiting and nausea.  Constitutional:   Patient denies fever, night sweats, weight loss, and fatigue.  Skin:   Patient denies skin rash/ lesion and itching.  Eyes:   Patient denies blurred vision and double vision.  Ears/ Nose/ Throat:   Patient denies sore throat and sinus problems.  Hematologic/Lymphatic:   Patient denies swollen glands and easy bruising.  Cardiovascular:   Patient denies leg swelling and chest pains.  Respiratory:   Patient denies cough and shortness of breath.  Endocrine:   Patient denies excessive thirst.  Musculoskeletal:   Patient denies back pain and joint pain.  Neurological:   Patient denies headaches and dizziness.  Psychologic:   Patient denies depression and anxiety.   VITAL SIGNS:      03/26/2023 01:17 PM  Weight 240 lb / 108.86 kg  Height 66 in / 167.64 cm  Temperature 97.6 F / 36.4 C  BMI 38.7 kg/m   MULTI-SYSTEM PHYSICAL EXAMINATION:    Constitutional: Well-nourished. No physical deformities. Normally developed. Good  grooming.  Respiratory: No labored breathing, no use of accessory muscles. Clear bilaterally.  Cardiovascular: Normal temperature, normal extremity pulses, no swelling, no varicosities. RRR.  Gastrointestinal: No mass, no tenderness, no rigidity, non obese abdomen. No CVAT.     Complexity of Data:  X-Ray Review: KUB: Reviewed Films.  C.T. Abdomen/Pelvis: Reviewed Films.    Notes:                     CLINICAL  DATA: Abdominal/flank pain with stone suspected   EXAM:  CT ABDOMEN AND PELVIS WITHOUT CONTRAST   TECHNIQUE:  Multidetector CT imaging of the abdomen and pelvis was performed  following the standard protocol without IV contrast.   RADIATION DOSE REDUCTION: This exam was performed according to the  departmental dose-optimization program which includes automated  exposure control, adjustment of the mA and/or kV according to  patient size and/or use of iterative reconstruction technique.   COMPARISON: None Available.   FINDINGS:  Lower chest: No contributory findings.   Hepatobiliary: Hepatic steatosis.No evidence of biliary obstruction  or stone.   Pancreas: Unremarkable.   Spleen: Unremarkable.   Adrenals/Urinary Tract: Negative adrenals. Left hydronephrosis and  hydroureter related to a UVJ calculus measuring 6 mm in length. 3 or  4 left renal calculi measuring up to 4 mm on coronal reformats. No  right nephrolithiasis. There is a right renal cyst with simple  appearance measuring up to 4.2 cm. No follow-up imaging is  recommended. Unremarkable bladder.   Stomach/Bowel: No obstruction. No appendicitis.   Vascular/Lymphatic: No acute vascular abnormality. No mass or  adenopathy.   Reproductive:No pathologic findings. Vasectomy type clips.   Other: No ascites or pneumoperitoneum.   Musculoskeletal: No acute abnormalities.   IMPRESSION:  1. Obstructing 6 mm stone at the left UVJ.  2. Left renal calculi.  3. Hepatic steatosis.    Electronically Signed  By: Tiburcio Pea M.D.  On: 03/18/2023 06:49   I independently reviewed his KUB x-ray. This demonstrates a stable 4 to 5 mm calcification in the vicinity of the left UVJ consistent with the previously diagnosed stone on his CT scan. The renal shadows are somewhat obscured by overlying bowel gas and no definitive calcifications are identified despite his known left renal nonobstructing calculi.   PROCEDURES:          KUB - [id number]  A single view of the abdomen is obtained.      Patient confirmed No Neulasta OnPro Device.           Urinalysis - 81003 Dipstick Dipstick Cont'd  Color: Yellow Bilirubin: Neg  Appearance: Clear Ketones: Neg  Specific Gravity: 1.020 Blood: Neg  pH: 6.5 Protein: Neg  Glucose: Neg Urobilinogen: 0.2    Nitrites: Neg    Leukocyte Esterase: Neg    Notes:      ASSESSMENT:      ICD-10 Details  1 GU:   Renal and ureteral calculus - N20.2    PLAN:           Schedule Return Visit/Planned Activity: Other See Visit Notes             Note: Will schedule surgery.          Document Letter(s):  Created for Patient: Clinical Summary    * Signed by Heloise Purpura, M.D. on 03/26/23 at 3:19 PM (EDT)*

## 2023-04-16 ENCOUNTER — Ambulatory Visit (HOSPITAL_COMMUNITY)
Admission: RE | Admit: 2023-04-16 | Discharge: 2023-04-16 | Disposition: A | Discharge status: Home / Self Care | Payer: BC Managed Care – PPO | Attending: Urology | Admitting: Urology

## 2023-04-16 ENCOUNTER — Ambulatory Visit (HOSPITAL_COMMUNITY): Payer: BC Managed Care – PPO

## 2023-04-16 ENCOUNTER — Ambulatory Visit (HOSPITAL_COMMUNITY): Payer: BC Managed Care – PPO | Admitting: Anesthesiology

## 2023-04-16 ENCOUNTER — Ambulatory Visit (HOSPITAL_COMMUNITY): Payer: BC Managed Care – PPO | Admitting: Medical

## 2023-04-16 ENCOUNTER — Encounter (HOSPITAL_COMMUNITY): Payer: Self-pay | Admitting: Urology

## 2023-04-16 ENCOUNTER — Other Ambulatory Visit: Payer: Self-pay

## 2023-04-16 ENCOUNTER — Encounter (HOSPITAL_COMMUNITY)
Admission: RE | Disposition: A | Discharge status: Home / Self Care | Payer: Self-pay | Source: Home / Self Care | Attending: Urology

## 2023-04-16 DIAGNOSIS — G4733 Obstructive sleep apnea (adult) (pediatric): Secondary | ICD-10-CM | POA: Diagnosis not present

## 2023-04-16 DIAGNOSIS — Z7984 Long term (current) use of oral hypoglycemic drugs: Secondary | ICD-10-CM | POA: Insufficient documentation

## 2023-04-16 DIAGNOSIS — J45909 Unspecified asthma, uncomplicated: Secondary | ICD-10-CM | POA: Diagnosis not present

## 2023-04-16 DIAGNOSIS — N202 Calculus of kidney with calculus of ureter: Secondary | ICD-10-CM | POA: Insufficient documentation

## 2023-04-16 DIAGNOSIS — D751 Secondary polycythemia: Secondary | ICD-10-CM | POA: Diagnosis not present

## 2023-04-16 DIAGNOSIS — N201 Calculus of ureter: Secondary | ICD-10-CM | POA: Diagnosis not present

## 2023-04-16 DIAGNOSIS — Z6838 Body mass index (BMI) 38.0-38.9, adult: Secondary | ICD-10-CM | POA: Insufficient documentation

## 2023-04-16 DIAGNOSIS — Z841 Family history of disorders of kidney and ureter: Secondary | ICD-10-CM | POA: Insufficient documentation

## 2023-04-16 DIAGNOSIS — Z09 Encounter for follow-up examination after completed treatment for conditions other than malignant neoplasm: Secondary | ICD-10-CM | POA: Diagnosis not present

## 2023-04-16 DIAGNOSIS — E669 Obesity, unspecified: Secondary | ICD-10-CM | POA: Diagnosis not present

## 2023-04-16 DIAGNOSIS — Z87442 Personal history of urinary calculi: Secondary | ICD-10-CM | POA: Diagnosis not present

## 2023-04-16 DIAGNOSIS — R7303 Prediabetes: Secondary | ICD-10-CM | POA: Insufficient documentation

## 2023-04-16 HISTORY — PX: CYSTOSCOPY/URETEROSCOPY/HOLMIUM LASER/STENT PLACEMENT: SHX6546

## 2023-04-16 LAB — GLUCOSE, CAPILLARY: Glucose-Capillary: 111 mg/dL — ABNORMAL HIGH (ref 70–99)

## 2023-04-16 SURGERY — CYSTOSCOPY/URETEROSCOPY/HOLMIUM LASER/STENT PLACEMENT
Anesthesia: General | Laterality: Left

## 2023-04-16 MED ORDER — DROPERIDOL 2.5 MG/ML IJ SOLN: 0.6250 mg | Freq: Once | INTRAMUSCULAR | Status: DC | PRN

## 2023-04-16 MED ORDER — FENTANYL CITRATE (PF) 100 MCG/2ML IJ SOLN
INTRAMUSCULAR | Status: DC | PRN
  Administered 2023-04-16 (×2): 100 ug via INTRAVENOUS

## 2023-04-16 MED ORDER — MIDAZOLAM HCL 2 MG/2ML IJ SOLN
INTRAMUSCULAR | Status: AC
  Filled 2023-04-16: qty 2

## 2023-04-16 MED ORDER — LABETALOL HCL 5 MG/ML IV SOLN
INTRAVENOUS | Status: AC
  Administered 2023-04-16: 5 mg via INTRAVENOUS
  Filled 2023-04-16: qty 4

## 2023-04-16 MED ORDER — PHENYLEPHRINE 80 MCG/ML (10ML) SYRINGE FOR IV PUSH (FOR BLOOD PRESSURE SUPPORT)
PREFILLED_SYRINGE | INTRAVENOUS | Status: AC
  Filled 2023-04-16: qty 10

## 2023-04-16 MED ORDER — DEXAMETHASONE SODIUM PHOSPHATE 10 MG/ML IJ SOLN
INTRAMUSCULAR | Status: DC | PRN
  Administered 2023-04-16: 5 mg via INTRAVENOUS

## 2023-04-16 MED ORDER — LIDOCAINE 2% (20 MG/ML) 5 ML SYRINGE
INTRAMUSCULAR | Status: DC | PRN
  Administered 2023-04-16: 100 mg via INTRAVENOUS

## 2023-04-16 MED ORDER — HYDROMORPHONE HCL 1 MG/ML IJ SOLN: 0.2500 mg | INTRAMUSCULAR | Status: DC | PRN

## 2023-04-16 MED ORDER — FENTANYL CITRATE (PF) 100 MCG/2ML IJ SOLN
INTRAMUSCULAR | Status: AC
  Filled 2023-04-16: qty 2

## 2023-04-16 MED ORDER — PHENYLEPHRINE 80 MCG/ML (10ML) SYRINGE FOR IV PUSH (FOR BLOOD PRESSURE SUPPORT)
PREFILLED_SYRINGE | INTRAVENOUS | Status: DC | PRN
Start: 2023-04-16 — End: 2023-04-16
  Administered 2023-04-16: 160 ug via INTRAVENOUS

## 2023-04-16 MED ORDER — LACTATED RINGERS IV SOLN: INTRAVENOUS | Status: DC

## 2023-04-16 MED ORDER — OXYCODONE HCL 5 MG PO TABS: 5.0000 mg | ORAL_TABLET | Freq: Once | ORAL | Status: DC | PRN

## 2023-04-16 MED ORDER — CEFAZOLIN SODIUM-DEXTROSE 2-4 GM/100ML-% IV SOLN: 2.0000 g | INTRAVENOUS | Status: DC

## 2023-04-16 MED ORDER — MIDAZOLAM HCL 5 MG/5ML IJ SOLN
INTRAMUSCULAR | Status: DC | PRN
  Administered 2023-04-16: 2 mg via INTRAVENOUS

## 2023-04-16 MED ORDER — LIDOCAINE HCL (PF) 2 % IJ SOLN
INTRAMUSCULAR | Status: AC
  Filled 2023-04-16: qty 5

## 2023-04-16 MED ORDER — LABETALOL HCL 5 MG/ML IV SOLN: 5.0000 mg | INTRAVENOUS | Status: DC | PRN

## 2023-04-16 MED ORDER — DEXAMETHASONE SODIUM PHOSPHATE 10 MG/ML IJ SOLN
INTRAMUSCULAR | Status: AC
  Filled 2023-04-16: qty 1

## 2023-04-16 MED ORDER — PROPOFOL 10 MG/ML IV BOLUS
INTRAVENOUS | Status: DC | PRN
  Administered 2023-04-16: 200 mg via INTRAVENOUS

## 2023-04-16 MED ORDER — CHLORHEXIDINE GLUCONATE 0.12 % MT SOLN
15.0000 mL | Freq: Once | OROMUCOSAL | Status: AC
  Administered 2023-04-16: 15 mL via OROMUCOSAL

## 2023-04-16 MED ORDER — ONDANSETRON HCL 4 MG/2ML IJ SOLN: 4.0000 mg | Freq: Once | INTRAMUSCULAR | Status: DC | PRN

## 2023-04-16 MED ORDER — OXYCODONE HCL 5 MG/5ML PO SOLN: 5.0000 mg | Freq: Once | ORAL | Status: DC | PRN

## 2023-04-16 MED ORDER — SODIUM CHLORIDE 0.9 % IR SOLN
Status: DC | PRN
  Administered 2023-04-16: 3000 mL

## 2023-04-16 MED ORDER — IOHEXOL 300 MG/ML  SOLN
INTRAMUSCULAR | Status: DC | PRN
  Administered 2023-04-16: 10 mL

## 2023-04-16 MED ORDER — ORAL CARE MOUTH RINSE: 15.0000 mL | Freq: Once | OROMUCOSAL | Status: AC

## 2023-04-16 MED ORDER — ONDANSETRON HCL 4 MG/2ML IJ SOLN
INTRAMUSCULAR | Status: DC | PRN
Start: 2023-04-16 — End: 2023-04-16
  Administered 2023-04-16: 4 mg via INTRAVENOUS

## 2023-04-16 MED ORDER — PROPOFOL 10 MG/ML IV BOLUS
INTRAVENOUS | Status: AC
  Filled 2023-04-16: qty 20

## 2023-04-16 MED ORDER — ONDANSETRON HCL 4 MG/2ML IJ SOLN
INTRAMUSCULAR | Status: AC
  Filled 2023-04-16: qty 2

## 2023-04-16 SURGICAL SUPPLY — 23 items
BAG COUNTER SPONGE SURGICOUNT (BAG) IMPLANT
BAG SPNG CNTER NS LX DISP (BAG)
BAG URO CATCHER STRL LF (MISCELLANEOUS) ×1 IMPLANT
BASKET ZERO TIP NITINOL 2.4FR (BASKET) IMPLANT
BSKT STON RTRVL ZERO TP 2.4FR (BASKET)
CATH URETL OPEN END 6FR 70 (CATHETERS) IMPLANT
CLOTH BEACON ORANGE TIMEOUT ST (SAFETY) ×1 IMPLANT
GLOVE SURG LX STRL 7.5 STRW (GLOVE) ×1 IMPLANT
GOWN STRL REUS W/ TWL XL LVL3 (GOWN DISPOSABLE) ×1 IMPLANT
GOWN STRL REUS W/TWL XL LVL3 (GOWN DISPOSABLE) ×1
GUIDEWIRE STR DUAL SENSOR (WIRE) ×1 IMPLANT
GUIDEWIRE ZIPWRE .038 STRAIGHT (WIRE) IMPLANT
IV NS 1000ML (IV SOLUTION) ×1
IV NS 1000ML BAXH (IV SOLUTION) ×1 IMPLANT
KIT TURNOVER KIT A (KITS) IMPLANT
LASER FIB FLEXIVA PULSE ID 365 (Laser) IMPLANT
MANIFOLD NEPTUNE II (INSTRUMENTS) ×1 IMPLANT
PACK CYSTO (CUSTOM PROCEDURE TRAY) ×1 IMPLANT
SHEATH NAVIGATOR HD 12/14X36 (SHEATH) IMPLANT
TRACTIP FLEXIVA PULS ID 200XHI (Laser) IMPLANT
TRACTIP FLEXIVA PULSE ID 200 (Laser)
TUBING CONNECTING 10 (TUBING) ×1 IMPLANT
TUBING UROLOGY SET (TUBING) ×1 IMPLANT

## 2023-04-16 NOTE — Anesthesia Postprocedure Evaluation (Signed)
Anesthesia Post Note  Patient: Anthony [name]  Procedure(s) Performed: CYSTOSCOPY, LEFT RETROGRADE, LEFT URETEROSCOPY (Left)     Patient location during evaluation: PACU Anesthesia Type: General Level of consciousness: awake and alert and oriented Pain management: pain level controlled Vital Signs Assessment: post-procedure vital signs reviewed and stable Respiratory status: spontaneous breathing, nonlabored ventilation and respiratory function stable Cardiovascular status: blood pressure returned to baseline and stable Postop Assessment: no apparent nausea or vomiting Anesthetic complications: no   No notable events documented.  Last Vitals:  Vitals:   04/16/23 1350 04/16/23 1400  BP: 122/69 (!) 132/96  Pulse: 86 91  Resp: 14 18  Temp: 36.7 C   SpO2: (!) 86% 94%    Last Pain:  Vitals:   04/16/23 1350  TempSrc:   PainSc: 0-No pain                 , A.

## 2023-04-16 NOTE — Anesthesia Procedure Notes (Signed)
Procedure Name: LMA Insertion Date/Time: 04/16/2023 1:24 PM  Performed by: Doran Clay, CRNAPre-anesthesia Checklist: Patient identified, Patient being monitored, Emergency Drugs available, Suction available and Timeout performed Patient Re-evaluated:Patient Re-evaluated prior to induction Oxygen Delivery Method: Circle system utilized Preoxygenation: Pre-oxygenation with 100% oxygen Induction Type: IV induction LMA: LMA inserted LMA Size: 5.0 Tube type: Oral Number of attempts: 1 Placement Confirmation: positive ETCO2 and breath sounds checked- equal and bilateral Dental Injury: Teeth and Oropharynx as per pre-operative assessment

## 2023-04-16 NOTE — Transfer of Care (Signed)
Immediate Anesthesia Transfer of Care Note  Patient: Anthony [name]  Procedure(s) Performed: CYSTOSCOPY, LEFT RETROGRADE, LEFT URETEROSCOPY (Left)  Patient Location: PACU  Anesthesia Type:General  Level of Consciousness: sedated  Airway & Oxygen Therapy: Patient Spontanous Breathing and Patient connected to face mask oxygen  Post-op Assessment: Report given to RN and Post -op Vital signs reviewed and stable  Post vital signs: Reviewed and stable  Last Vitals:  Vitals Value Taken Time  BP 122/69 04/16/23 1349  Temp    Pulse 93 04/16/23 1351  Resp 21 04/16/23 1351  SpO2 93 % 04/16/23 1351  Vitals shown include unfiled device data.  Last Pain:  Vitals:   04/16/23 1238  TempSrc: Oral  PainSc: 0-No pain         Complications: No notable events documented.

## 2023-04-16 NOTE — Discharge Instructions (Signed)
You may see some blood in the urine and may have some burning with urination for 48-72 hours. You also may notice that you have to urinate more frequently or urgently after your procedure which is normal.  You should call should you develop an inability urinate, fever > 101, persistent nausea and vomiting that prevents you from eating or drinking to stay hydrated.    

## 2023-04-16 NOTE — Interval H&P Note (Signed)
History and Physical Interval Note:  04/16/2023 1:00 PM  Anthony [name]  has presented today for surgery, with the diagnosis of LEFT URETERAL CALCULUS.  The various methods of treatment have been discussed with the patient and family. After consideration of risks, benefits and other options for treatment, the patient has consented to  Procedure(s): CYSTOSCOPY/LEFT URETEROSCOPY/HOLMIUM LASER/LEFT URETERAL STENT PLACEMENT (Left) as a surgical intervention.  The patient's history has been reviewed, patient examined, no change in status, stable for surgery.  I have reviewed the patient's chart and labs.  Questions were answered to the patient's satisfaction.     [employer]

## 2023-04-16 NOTE — Op Note (Signed)
Preoperative diagnosis: Left ureteral calculus  Postoperative diagnosis: History of left ureteral calculus  Procedure:  Cystoscopy Left ureteroscopy Left retrograde pyelography with interpretation  Surgeon: Moody Bruins. M.D.  Anesthesia: General  Complications: None  Intraoperative findings: Left retrograde pyelography demonstrated a narrowed distal left ureteral with proximal ureteral dilation but no clear filling defect.  EBL: Minimal  Indication: Anthony [name] is a 52 y.o. year old patient with urolithiasis. He recently presented with a symptomatic 5-6 mm distal left ureteral stone.  He has been straining his urine but has not passed his stone. After reviewing the management options for treatment, the patient elected to proceed with the above surgical procedure(s). We have discussed the potential benefits and risks of the procedure, side effects of the proposed treatment, the likelihood of the patient achieving the goals of the procedure, and any potential problems that might occur during the procedure or recuperation. Informed consent has been obtained.  Description of procedure:  The patient was taken to the operating room and general anesthesia was induced.  The patient was placed in the dorsal lithotomy position, prepped and draped in the usual sterile fashion, and preoperative antibiotics were administered. A preoperative time-out was performed.   Cystourethroscopy was performed.  The patient's urethra was examined and was normal. The bladder was then systematically examined in its entirety. There was no evidence for any bladder tumors, stones, or other mucosal pathology.    Attention then turned to the left ureteral orifice and a ureteral catheter was used to intubate the ureteral orifice.  Omnipaque contrast was injected through the ureteral catheter and a retrograde pyelogram was performed with findings as dictated above.  A 0.38 sensor guidewire was then advanced  up the left ureter into the renal pelvis under fluoroscopic guidance. The 6 Fr semirigid ureteroscope was then advanced into the ureter next to the guidewire and up to the proximal ureter.  The ureter was dilated proximally but no stone was present.  I injected some additional contrast through the ureteroscope and no proximal filling defect was noted to suggest proximal migration.  The ureter was carefully examined as the ureteroscope was withdrawn and again there was no stone. The wire was then removed.  The bladder was then emptied and the procedure ended.  The patient appeared to tolerate the procedure well and without complications.  The patient was able to be awakened and transferred to the recovery unit in satisfactory condition.

## 2023-04-17 ENCOUNTER — Encounter (HOSPITAL_COMMUNITY): Payer: Self-pay | Admitting: Urology

## 2023-05-12 DIAGNOSIS — N202 Calculus of kidney with calculus of ureter: Secondary | ICD-10-CM | POA: Diagnosis not present

## 2023-05-14 NOTE — Progress Notes (Signed)
PATIENT: Anthony [name] DOB: [birth year]  REASON FOR VISIT: follow up HISTORY FROM: patient  Virtual Visit via Telephone Note  I connected with Anthony [name] on 05/19/23 at  9:45 AM EDT by telephone and verified that I am speaking with the correct person using two identifiers.   I discussed the limitations, risks, security and privacy concerns of performing an evaluation and management service by telephone and the availability of in person appointments. I also discussed with the patient that there may be a patient responsible charge related to this service. The patient expressed understanding and agreed to proceed.   History of Present Illness:  05/19/23 ALL: Anthony [name] is a 52 y.o. male here today for follow up for OSA on CPAP. He was last seen by Dr Frances Furbish 12/2022 and was continuing to adjust to therapy. He had not noted any significant benefit of using therapy. Since, he reports doing fairly well. He is no longer snoring or waking up gasping for air. He continues to wake 3-4 times during the night. No particular thing that is waking him up. He discussed concerns of insomnia with Dr Frances Furbish at last visit. Melatonin was not very helpful. He is a light sleeper. He has 4 children, three of them are still living in the home.     History (copied from Anthony [name]'s previous note)  Anthony [name] is a 52 year old male with an underlying medical history of asthma, allergies, and obesity, who presents for follow-up consultation of his obstructive sleep apnea after interim testing and starting home AutoPap therapy.  The patient is unaccompanied today.  I saw him on 06/02/2022 at the request of his primary care physician, at which time we talked about his prior diagnosis of sleep apnea.  He had a home sleep test in June 2019 which showed moderate sleep apnea.  He had not pursued treatment in the past.  He was advised to proceed with reevaluation with a home sleep test.  He had a home sleep test on  10/21/2022 which showed severe obstructive sleep apnea with an AHI of 61.1/h, O2 nadir 78% with significant time below or at 88% saturation of over 50 minutes for the night, indicating nocturnal hypoxemia.  Snoring ranged from moderate to loud.  He was advised to proceed with home AutoPap therapy.  His set up date was 11/19/2022, he has a ResMed air sense 11 AutoSet machine.  His DME company is [employer].    Today, 01/12/2023: I reviewed his AutoPap compliance data from 11/19/2022 through 12/18/2022, which is a total of 30 days, during which time he used his machine 29 days with percent use days greater than 4 hours at 93%, indicating excellent compliance with an average usage of 4 hours and 34 minutes, residual AHI at 1.5/h, at goal, average pressure for the 95th percentile at 13.3 cm with a range of 7 to 14 cm with EPR of 3.  Leak on the high side with some fluctuation noted, 95th percentile at 50.3 L/min.  He reports still struggling with tolerance.  He is a restless sleeper and mask dislodges at times.  He does report decrease in snoring per wife's feedback, also his hemoglobin and hematocrit apparently are little better than before, he has a history of elevated hemoglobin and hematocrit values in the past.  He is very motivated to continue with treatment but has not really noticed a big improvement in his energy level and daytime somnolence, also has trouble maintaining sleep.  He  has tried melatonin in the past and slept maybe 30 minutes more with melatonin compared to without but had to take up to 20 mg.  He is willing to retry it.  He has had some interim weight gain.  His blood pressure and pulse are little bit elevated, has not had any caffeine today, limits his caffeine to 1 energy drink and 1 cup of tea no typically, has had some work-related stress.   Observations/Objective:  Generalized: Well developed, in no acute distress  Mentation: Alert oriented to time, place, history taking. Follows all  commands speech and language fluent   Assessment and Plan:  52 y.o. year old male  has a past medical history of Allergy, Asthma, Blood dyscrasia, Chronic kidney disease, History of kidney stones, Neuromuscular disorder (HCC), OSA (obstructive sleep apnea), Pneumonia, and Pre-diabetes. here with    ICD-10-CM   1. OSA on CPAP  G47.33       Anthony [name] reports . Compliance review shows acceptable 4 hour compliance but sub optimal 4 hour compliance. We discussed concerns of insomnia and possible treatment options including valerian root or magnesium versus CBT. He was encouraged to continue CPAP nightly for at least 4 hours. We will update supply orders as indicated. Healthy lifestyle habits encouraged. He was encouraged to continue follow up with care team as directed. He will return to see me in 6 months.    No orders of the defined types were placed in this encounter.   No orders of the defined types were placed in this encounter.    Follow Up Instructions:  I discussed the assessment and treatment plan with the patient. The patient was provided an opportunity to ask questions and all were answered. The patient agreed with the plan and demonstrated an understanding of the instructions.   The patient was advised to call back or seek an in-person evaluation if the symptoms worsen or if the condition fails to improve as anticipated.  I provided 15 minutes of non-face-to-face time during this encounter. Patient located at their place of residence during Mychart visit. Provider is in the office.    Shawnie Dapper, NP

## 2023-05-14 NOTE — Patient Instructions (Addendum)
Please continue using your CPAP regularly. While your insurance requires that you use CPAP at least 4 hours each night on 70% of the nights, I recommend, that you not skip any nights and use it throughout the night if you can. Getting used to CPAP and staying with the treatment long term does take time and patience and discipline. Untreated obstructive sleep apnea when it is moderate to severe can have an adverse impact on cardiovascular health and raise her risk for heart disease, arrhythmias, hypertension, congestive heart failure, stroke and diabetes. Untreated obstructive sleep apnea causes sleep disruption, nonrestorative sleep, and sleep deprivation. This can have an impact on your day to day functioning and cause daytime sleepiness and impairment of cognitive function, memory loss, mood disturbance, and problems focussing. Using CPAP regularly can improve these symptoms.  Consider trial of magnesium or valerian root for sleep aid. You can find these over the counter at most pharmacies. You could also consider cognitive behavioral therapy with a psychologist if you choose. I have attached information on insomnia and quality sleep habits. See if you can implement any of these practices that could help. Consider adding in exercise daily if you don't already.   Follow up in 6 months

## 2023-05-18 ENCOUNTER — Telehealth: Payer: Self-pay

## 2023-05-18 NOTE — Telephone Encounter (Signed)
Please read MyChart message from today 05/18/2023

## 2023-05-19 ENCOUNTER — Encounter: Payer: Self-pay | Admitting: Family Medicine

## 2023-05-19 ENCOUNTER — Telehealth (INDEPENDENT_AMBULATORY_CARE_PROVIDER_SITE_OTHER): Payer: BC Managed Care – PPO | Admitting: Family Medicine

## 2023-05-19 DIAGNOSIS — G4733 Obstructive sleep apnea (adult) (pediatric): Secondary | ICD-10-CM

## 2023-08-20 DIAGNOSIS — Z1159 Encounter for screening for other viral diseases: Secondary | ICD-10-CM | POA: Diagnosis not present

## 2023-08-20 DIAGNOSIS — R7989 Other specified abnormal findings of blood chemistry: Secondary | ICD-10-CM | POA: Diagnosis not present

## 2023-12-03 NOTE — Progress Notes (Signed)
 PATIENT: Anthony [name] DOB: 1971-02-14  REASON FOR VISIT: follow up HISTORY FROM: patient  Virtual Visit via Mychart video Note  I connected with Anthony [name] on 12/08/23 at  8:15 AM EDT by video and verified that I am speaking with the correct person using two identifiers.   I discussed the limitations, risks, security and privacy concerns of performing an evaluation and management service by video and the availability of in person appointments. I also discussed with the patient that there may be a patient responsible charge related to this service. The patient expressed understanding and agreed to proceed.   History of Present Illness:  12/08/23 ALL: Anthony [name] returns for follow up for OSA on CPAP. He was last seen 05/2023 and doing better with compliance. He had noted less snoring and no longer gasping for air during the night. Since, he reports more inconsistent use of therapy. He feels this is due to multiple factors. He continues to have difficulty staying asleep. He tried melatonin but didn't feel it was helpful. He did not try valerian root. He can not swallow tablets. Needs solution. He does have more stress at work but feels that should get better in the near future. Wife is an Airline pilot and schedules are really busy right now. He has not changed mask out regularly. He denies concerns with machine or supplies.   ESS 12/24    05/19/2023 ALL:  Anthony [name] is a 53 y.o. male here today for follow up for OSA on CPAP. He was last seen by Dr Frances Furbish 12/2022 and was continuing to adjust to therapy. He had not noted any significant benefit of using therapy. Since, he reports doing fairly well. He is no longer snoring or waking up gasping for air. He continues to wake 3-4 times during the night. No particular thing that is waking him up. He discussed concerns of insomnia with Dr Frances Furbish at last visit. Melatonin was not very helpful. He is a light sleeper. He has 4 children, three of them  are still living in the home.     History (copied from Anthony [name]'s previous note)  Anthony [name] is a 53 year old male with an underlying medical history of asthma, allergies, and obesity, who presents for follow-up consultation of his obstructive sleep apnea after interim testing and starting home AutoPap therapy.  The patient is unaccompanied today.  I saw him on 06/02/2022 at the request of his primary care physician, at which time we talked about his prior diagnosis of sleep apnea.  He had a home sleep test in June 2019 which showed moderate sleep apnea.  He had not pursued treatment in the past.  He was advised to proceed with reevaluation with a home sleep test.  He had a home sleep test on 10/21/2022 which showed severe obstructive sleep apnea with an AHI of 61.1/h, O2 nadir 78% with significant time below or at 88% saturation of over 50 minutes for the night, indicating nocturnal hypoxemia.  Snoring ranged from moderate to loud.  He was advised to proceed with home AutoPap therapy.  His set up date was 11/19/2022, he has a ResMed air sense 11 AutoSet machine.  His DME company is [employer].    Today, 01/12/2023: I reviewed his AutoPap compliance data from 11/19/2022 through 12/18/2022, which is a total of 30 days, during which time he used his machine 29 days with percent use days greater than 4 hours at 93%, indicating excellent compliance with an average usage  of 4 hours and 34 minutes, residual AHI at 1.5/h, at goal, average pressure for the 95th percentile at 13.3 cm with a range of 7 to 14 cm with EPR of 3.  Leak on the high side with some fluctuation noted, 95th percentile at 50.3 L/min.  He reports still struggling with tolerance.  He is a restless sleeper and mask dislodges at times.  He does report decrease in snoring per wife's feedback, also his hemoglobin and hematocrit apparently are little better than before, he has a history of elevated hemoglobin and hematocrit values in the past.  He is very  motivated to continue with treatment but has not really noticed a big improvement in his energy level and daytime somnolence, also has trouble maintaining sleep.  He has tried melatonin in the past and slept maybe 30 minutes more with melatonin compared to without but had to take up to 20 mg.  He is willing to retry it.  He has had some interim weight gain.  His blood pressure and pulse are little bit elevated, has not had any caffeine today, limits his caffeine to 1 energy drink and 1 cup of tea no typically, has had some work-related stress.   Observations/Objective:  Generalized: Well developed, in no acute distress  Mentation: Alert oriented to time, place, history taking. Follows all commands speech and language fluent   Assessment and Plan:  53 y.o. year old male  has a past medical history of Allergy, Asthma, Blood dyscrasia, Chronic kidney disease, History of kidney stones, Neuromuscular disorder (HCC), OSA (obstructive sleep apnea), Pneumonia, and Pre-diabetes. here with    ICD-10-CM   1. OSA on CPAP  G47.33 For home use only DME continuous positive airway pressure (CPAP)      Anthony [name] reports less consistent use over the past few months. Compliance review shows sub optimal usage. We discussed concerns of insomnia and possible treatment options including valerian root or magnesium versus CBT. He was encouraged to continue CPAP nightly for at least 4 hours. He will monitor for leak at home. Advised to change out mask regularly. We will update supply orders as indicated. Healthy lifestyle habits encouraged. He was encouraged to continue follow up with care team as directed. He will return to see me in 6 months.    Orders Placed This Encounter  Procedures   For home use only DME continuous positive airway pressure (CPAP)    Heated Humidity with all supplies as needed    Length of Need:   Lifetime    Patient has OSA or probable OSA:   Yes    Is the patient currently using CPAP in the  home:   Yes    Settings:   Other see comments    CPAP supplies needed:   Mask, headgear, cushions, filters, heated tubing and water chamber    No orders of the defined types were placed in this encounter.    Follow Up Instructions:  I discussed the assessment and treatment plan with the patient. The patient was provided an opportunity to ask questions and all were answered. The patient agreed with the plan and demonstrated an understanding of the instructions.   The patient was advised to call back or seek an in-person evaluation if the symptoms worsen or if the condition fails to improve as anticipated.  I provided 15 minutes of non-face-to-face time during this encounter. Patient located at their place of residence during Mychart video visit. Provider is in the office.    Daimian Sudberry  Neila Teem, NP

## 2023-12-07 ENCOUNTER — Encounter: Payer: Self-pay | Admitting: *Deleted

## 2023-12-07 NOTE — Progress Notes (Unsigned)
 Marland Kitchen

## 2023-12-08 ENCOUNTER — Encounter: Payer: Self-pay | Admitting: Family Medicine

## 2023-12-08 ENCOUNTER — Telehealth (INDEPENDENT_AMBULATORY_CARE_PROVIDER_SITE_OTHER): Payer: BC Managed Care – PPO | Admitting: Family Medicine

## 2023-12-08 DIAGNOSIS — G4733 Obstructive sleep apnea (adult) (pediatric): Secondary | ICD-10-CM

## 2023-12-08 NOTE — Patient Instructions (Signed)
 Please continue using your CPAP regularly. While your insurance requires that you use CPAP at least 4 hours each night on 70% of the nights, I recommend, that you not skip any nights and use it throughout the night if you can. Getting used to CPAP and staying with the treatment long term does take time and patience and discipline. Untreated obstructive sleep apnea when it is moderate to severe can have an adverse impact on cardiovascular health and raise her risk for heart disease, arrhythmias, hypertension, congestive heart failure, stroke and diabetes. Untreated obstructive sleep apnea causes sleep disruption, nonrestorative sleep, and sleep deprivation. This can have an impact on your day to day functioning and cause daytime sleepiness and impairment of cognitive function, memory loss, mood disturbance, and problems focussing. Using CPAP regularly can improve these symptoms.  We will update supply orders, today. Try to make sure you are changing your mask out every 30 days. Consider trial of valerian root or ashwagandha   Follow up in 6 months

## 2024-06-13 ENCOUNTER — Telehealth: Payer: Self-pay | Admitting: Family Medicine

## 2024-06-13 NOTE — Progress Notes (Deleted)
 PATIENT: Anthony [name] DOB: 1971/01/29  REASON FOR VISIT: follow up HISTORY FROM: patient  Virtual Visit via Mychart video Note  I connected with Anthony [name] on 06/13/24 at  8:45 AM EDT by video and verified that I am speaking with the correct person using two identifiers.   I discussed the limitations, risks, security and privacy concerns of performing an evaluation and management service by video and the availability of in person appointments. I also discussed with the patient that there may be a patient responsible charge related to this service. The patient expressed understanding and agreed to proceed.   History of Present Illness:  06/13/24 ALL (Mychart): Anthony [name] returns for follow up for OSA on CPAP. He was last seen 12/2023 and reported inconsistent use for multiple reasons. Since,   12/08/2023 ALL (Mychart):  Anthony [name] returns for follow up for OSA on CPAP. He was last seen 05/2023 and doing better with compliance. He had noted less snoring and no longer gasping for air during the night. Since, he reports more inconsistent use of therapy. He feels this is due to multiple factors. He continues to have difficulty staying asleep. He tried melatonin but didn't feel it was helpful. He did not try valerian root. He can not swallow tablets. Needs solution. He does have more stress at work but feels that should get better in the near future. Wife is an Airline pilot and schedules are really busy right now. He has not changed mask out regularly. He denies concerns with machine or supplies.   ESS 12/24    05/19/2023 ALL:  Anthony [name] is a 53 y.o. male here today for follow up for OSA on CPAP. He was last seen by Dr Buck 12/2022 and was continuing to adjust to therapy. He had not noted any significant benefit of using therapy. Since, he reports doing fairly well. He is no longer snoring or waking up gasping for air. He continues to wake 3-4 times during the night. No particular thing that is  waking him up. He discussed concerns of insomnia with Dr Buck at last visit. Melatonin was not very helpful. He is a light sleeper. He has 4 children, three of them are still living in the home.     History (copied from Anthony [name]'s previous note)  Anthony [name] is a 53 year old male with an underlying medical history of asthma, allergies, and obesity, who presents for follow-up consultation of his obstructive sleep apnea after interim testing and starting home AutoPap therapy.  The patient is unaccompanied today.  I saw him on 06/02/2022 at the request of his primary care physician, at which time we talked about his prior diagnosis of sleep apnea.  He had a home sleep test in June 2019 which showed moderate sleep apnea.  He had not pursued treatment in the past.  He was advised to proceed with reevaluation with a home sleep test.  He had a home sleep test on 10/21/2022 which showed severe obstructive sleep apnea with an AHI of 61.1/h, O2 nadir 78% with significant time below or at 88% saturation of over 50 minutes for the night, indicating nocturnal hypoxemia.  Snoring ranged from moderate to loud.  He was advised to proceed with home AutoPap therapy.  His set up date was 11/19/2022, he has a ResMed air sense 11 AutoSet machine.  His DME company is [employer].    Today, 01/12/2023: I reviewed his AutoPap compliance data from 11/19/2022 through 12/18/2022, which is a total  of 30 days, during which time he used his machine 29 days with percent use days greater than 4 hours at 93%, indicating excellent compliance with an average usage of 4 hours and 34 minutes, residual AHI at 1.5/h, at goal, average pressure for the 95th percentile at 13.3 cm with a range of 7 to 14 cm with EPR of 3.  Leak on the high side with some fluctuation noted, 95th percentile at 50.3 L/min.  He reports still struggling with tolerance.  He is a restless sleeper and mask dislodges at times.  He does report decrease in snoring per wife's feedback,  also his hemoglobin and hematocrit apparently are little better than before, he has a history of elevated hemoglobin and hematocrit values in the past.  He is very motivated to continue with treatment but has not really noticed a big improvement in his energy level and daytime somnolence, also has trouble maintaining sleep.  He has tried melatonin in the past and slept maybe 30 minutes more with melatonin compared to without but had to take up to 20 mg.  He is willing to retry it.  He has had some interim weight gain.  His blood pressure and pulse are little bit elevated, has not had any caffeine today, limits his caffeine to 1 energy drink and 1 cup of tea no typically, has had some work-related stress.   Observations/Objective:  Generalized: Well developed, in no acute distress  Mentation: Alert oriented to time, place, history taking. Follows all commands speech and language fluent   Assessment and Plan:  53 y.o. year old male  has a past medical history of Allergy, Asthma, Blood dyscrasia, Chronic kidney disease, History of kidney stones, Neuromuscular disorder (HCC), OSA (obstructive sleep apnea), Pneumonia, and Pre-diabetes. here with  No diagnosis found.   Anthony [name] reports less consistent use over the past few months. Compliance review shows sub optimal usage. We discussed concerns of insomnia and possible treatment options including valerian root or magnesium versus CBT. He was encouraged to continue CPAP nightly for at least 4 hours. He will monitor for leak at home. Advised to change out mask regularly. We will update supply orders as indicated. Healthy lifestyle habits encouraged. He was encouraged to continue follow up with care team as directed. He will return to see me in 6 months.    No orders of the defined types were placed in this encounter.   No orders of the defined types were placed in this encounter.    Follow Up Instructions:  I discussed the assessment and treatment  plan with the patient. The patient was provided an opportunity to ask questions and all were answered. The patient agreed with the plan and demonstrated an understanding of the instructions.   The patient was advised to call back or seek an in-person evaluation if the symptoms worsen or if the condition fails to improve as anticipated.  I provided 15 minutes of non-face-to-face time during this encounter. Patient located at their place of residence during Mychart video visit. Provider is in the office.    Wilmont Olund, NP

## 2024-06-13 NOTE — Telephone Encounter (Signed)
 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 Will call back to reschedule

## 2024-06-13 NOTE — Patient Instructions (Incomplete)

## 2024-06-14 ENCOUNTER — Telehealth: Admitting: Family Medicine
